# Patient Record
Sex: Female | Born: 1985 | Race: White | Hispanic: No | Marital: Single | Smoking: Never smoker
Health system: Southern US, Community
[De-identification: ages and names within clinical notes are randomized; demographics above are authoritative.]

## PROBLEM LIST (undated history)

## (undated) DIAGNOSIS — F32A Depression, unspecified: Secondary | ICD-10-CM

## (undated) DIAGNOSIS — F419 Anxiety disorder, unspecified: Secondary | ICD-10-CM

## (undated) DIAGNOSIS — F909 Attention-deficit hyperactivity disorder, unspecified type: Secondary | ICD-10-CM

## (undated) DIAGNOSIS — G8929 Other chronic pain: Secondary | ICD-10-CM

## (undated) DIAGNOSIS — R7303 Prediabetes: Secondary | ICD-10-CM

## (undated) DIAGNOSIS — G43909 Migraine, unspecified, not intractable, without status migrainosus: Secondary | ICD-10-CM

## (undated) HISTORY — DX: Migraine, unspecified, not intractable, without status migrainosus: G43.909

## (undated) HISTORY — DX: Other chronic pain: G89.29

## (undated) HISTORY — DX: Prediabetes: R73.03

## (undated) HISTORY — DX: Depression, unspecified: F32.A

## (undated) HISTORY — DX: Attention-deficit hyperactivity disorder, unspecified type: F90.9

## (undated) HISTORY — DX: Anxiety disorder, unspecified: F41.9

---
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
## 2008-06-15 ENCOUNTER — Other Ambulatory Visit: Admission: RE | Admit: 2008-06-15 | Discharge: 2008-06-15 | Payer: Self-pay | Admitting: Family Medicine

## 2009-01-08 HISTORY — PX: MOUTH SURGERY: SHX715

## 2009-05-06 ENCOUNTER — Other Ambulatory Visit: Admission: RE | Admit: 2009-05-06 | Discharge: 2009-05-06 | Payer: Self-pay | Admitting: Family Medicine

## 2010-01-27 ENCOUNTER — Encounter
Admission: RE | Admit: 2010-01-27 | Discharge: 2010-01-27 | Payer: Self-pay | Source: Home / Self Care | Attending: Family Medicine | Admitting: Family Medicine

## 2010-08-10 ENCOUNTER — Inpatient Hospital Stay (HOSPITAL_COMMUNITY)
Admission: EM | Admit: 2010-08-10 | Discharge: 2010-08-11 | DRG: 449 | Disposition: A | Discharge status: Other Acute Inpatient Hospital | Payer: BC Managed Care – PPO | Attending: Internal Medicine | Admitting: Internal Medicine

## 2010-08-10 DIAGNOSIS — F319 Bipolar disorder, unspecified: Secondary | ICD-10-CM | POA: Diagnosis present

## 2010-08-10 DIAGNOSIS — E876 Hypokalemia: Secondary | ICD-10-CM | POA: Diagnosis present

## 2010-08-10 DIAGNOSIS — H5500 Unspecified nystagmus: Secondary | ICD-10-CM | POA: Diagnosis present

## 2010-08-10 DIAGNOSIS — T426X1A Poisoning by other antiepileptic and sedative-hypnotic drugs, accidental (unintentional), initial encounter: Principal | ICD-10-CM | POA: Diagnosis present

## 2010-08-10 DIAGNOSIS — Y92009 Unspecified place in unspecified non-institutional (private) residence as the place of occurrence of the external cause: Secondary | ICD-10-CM

## 2010-08-10 DIAGNOSIS — T50992A Poisoning by other drugs, medicaments and biological substances, intentional self-harm, initial encounter: Secondary | ICD-10-CM | POA: Diagnosis present

## 2010-08-10 DIAGNOSIS — R269 Unspecified abnormalities of gait and mobility: Secondary | ICD-10-CM | POA: Diagnosis present

## 2010-08-10 LAB — CBC
HCT: 40.5 % (ref 36.0–46.0)
Hemoglobin: 14 g/dL (ref 12.0–15.0)
MCH: 30.6 pg (ref 26.0–34.0)
MCHC: 34.6 g/dL (ref 30.0–36.0)
MCV: 88.4 fL (ref 78.0–100.0)
Platelets: 286 10*3/uL (ref 150–400)
RBC: 4.58 MIL/uL (ref 3.87–5.11)
RDW: 12.5 % (ref 11.5–15.5)
WBC: 2.5 10*3/uL — ABNORMAL LOW (ref 4.0–10.5)

## 2010-08-10 LAB — COMPREHENSIVE METABOLIC PANEL
ALT: 10 U/L (ref 0–35)
AST: 11 U/L (ref 0–37)
Albumin: 4.3 g/dL (ref 3.5–5.2)
Alkaline Phosphatase: 83 U/L (ref 39–117)
BUN: 9 mg/dL (ref 6–23)
CO2: 24 mEq/L (ref 19–32)
Calcium: 9.4 mg/dL (ref 8.4–10.5)
Chloride: 102 mEq/L (ref 96–112)
Creatinine, Ser: 0.95 mg/dL (ref 0.50–1.10)
GFR calc Af Amer: >60 mL/min (ref >60)
GFR calc non Af Amer: >60 mL/min (ref >60)
Glucose, Bld: 84 mg/dL (ref 70–99)
Potassium: 3.5 mEq/L (ref 3.5–5.1)
Sodium: 138 mEq/L (ref 135–145)
Total Bilirubin: 0.5 mg/dL (ref 0.3–1.2)
Total Protein: 8 g/dL (ref 6.0–8.3)

## 2010-08-10 LAB — DIFFERENTIAL
Basophils Absolute: 0 10*3/uL (ref 0.0–0.1)
Basophils Relative: 0 % (ref 0–1)
Eosinophils Absolute: 0 10*3/uL (ref 0.0–0.7)
Eosinophils Relative: 0 % (ref 0–5)
Lymphocytes Relative: 24 % (ref 12–46)
Lymphs Abs: 0.6 10*3/uL — ABNORMAL LOW (ref 0.7–4.0)
Monocytes Absolute: 0.2 10*3/uL (ref 0.1–1.0)
Monocytes Relative: 8 % (ref 3–12)
Neutro Abs: 1.7 10*3/uL (ref 1.7–7.7)
Neutrophils Relative %: 67 % (ref 43–77)

## 2010-08-10 LAB — URINALYSIS, ROUTINE W REFLEX MICROSCOPIC
Bilirubin Urine: NEGATIVE
Glucose, UA: NEGATIVE mg/dL
Hgb urine dipstick: NEGATIVE
Specific Gravity, Urine: 1.034 — ABNORMAL HIGH (ref 1.005–1.030)
Urobilinogen, UA: 0.2 mg/dL (ref 0.0–1.0)
pH: 6 (ref 5.0–8.0)

## 2010-08-10 LAB — RAPID URINE DRUG SCREEN, HOSP PERFORMED
Amphetamines: NOT DETECTED
Benzodiazepines: NOT DETECTED
Cocaine: NOT DETECTED
Opiates: NOT DETECTED
Tetrahydrocannabinol: NOT DETECTED

## 2010-08-10 LAB — VALPROIC ACID LEVEL: Valproic Acid Lvl: 201.2 ug/mL (ref 50.0–100.0)

## 2010-08-10 LAB — ETHANOL: Alcohol, Ethyl (B): <11 mg/dL (ref 0–11)

## 2010-08-11 ENCOUNTER — Inpatient Hospital Stay (HOSPITAL_COMMUNITY)
Admission: AD | Admit: 2010-08-11 | Discharge: 2010-08-18 | DRG: 430 | Disposition: A | Discharge status: Home / Self Care | Payer: BC Managed Care – PPO | Source: Ambulatory Visit | Attending: Psychiatry | Admitting: Psychiatry

## 2010-08-11 DIAGNOSIS — F329 Major depressive disorder, single episode, unspecified: Secondary | ICD-10-CM

## 2010-08-11 DIAGNOSIS — T426X1A Poisoning by other antiepileptic and sedative-hypnotic drugs, accidental (unintentional), initial encounter: Secondary | ICD-10-CM

## 2010-08-11 DIAGNOSIS — F3289 Other specified depressive episodes: Secondary | ICD-10-CM

## 2010-08-11 DIAGNOSIS — E669 Obesity, unspecified: Secondary | ICD-10-CM

## 2010-08-11 DIAGNOSIS — T50992A Poisoning by other drugs, medicaments and biological substances, intentional self-harm, initial encounter: Secondary | ICD-10-CM

## 2010-08-11 DIAGNOSIS — F259 Schizoaffective disorder, unspecified: Principal | ICD-10-CM

## 2010-08-11 DIAGNOSIS — F29 Unspecified psychosis not due to a substance or known physiological condition: Secondary | ICD-10-CM

## 2010-08-11 LAB — COMPREHENSIVE METABOLIC PANEL
ALT: 9 U/L (ref 0–35)
Alkaline Phosphatase: 72 U/L (ref 39–117)
BUN: 9 mg/dL (ref 6–23)
CO2: 26 mEq/L (ref 19–32)
Calcium: 9.6 mg/dL (ref 8.4–10.5)
GFR calc Af Amer: >60 mL/min (ref >60)
GFR calc non Af Amer: >60 mL/min (ref >60)
Glucose, Bld: 94 mg/dL (ref 70–99)
Sodium: 137 mEq/L (ref 135–145)

## 2010-08-11 LAB — CBC
HCT: 36.8 % (ref 36.0–46.0)
Hemoglobin: 12.4 g/dL (ref 12.0–15.0)
MCH: 30 pg (ref 26.0–34.0)
RBC: 4.13 MIL/uL (ref 3.87–5.11)

## 2010-08-11 NOTE — H&P (Signed)
NAMEVON, INSCOE NO.:  [id number]  MEDICAL RECORD NO.:  [id number]  LOCATION:  WLED                         FACILITY:  [facility]  PHYSICIAN:  Gery Pray, MD      DATE OF BIRTH:  1985-05-14  DATE OF ADMISSION:  08/10/2010 DATE OF DISCHARGE:                             HISTORY & PHYSICAL   PRIMARY CARE PHYSICIAN:  Elvera Lennox, PA  PSYCHIATRIST:  Tiajuana Amass, MD  CODE STATUS:  Full code.  CHIEF COMPLAINT:  Depakote overdose.  HISTORY OF PRESENT ILLNESS:  This is a 25 year old female with known history of bipolar disease and depression.  Her mom noted that she was acting a little bit loopy and a little bit sleepy since yesterday. Apparently, there is a viral illness going around the house, therefore she thought initially felt nothing albeit sometime yesterday, the patient checked with her mom and told that she was having blurred vision.  She was having ringing, her ears felt full, and she was not feeling quite right.  Again, the mother attributed this to viral illness that was going around the house.  Today, the mom noted that she continued to act loopy and her gait was quite unsteady.  During further conversation, the patient finally told her mother that she took a bottle of Depakote from Tuesday, 2 days ago.  Per the mother, this is the medication she is no longer on.  It was almost a full bottle. Currently, still not clear as to the dose.  The mom took her in to the hospital.  Per the patient, she was not trying to commit suicide.  She was just curious to see what taking the bottle of medication would do to her. The patient does have a history of depression.  She was recently diagnosed with bipolar disease.  She has a history of cutting.  She has several laceration scars on her arm from cutting.  Here in the ER, the patient does answer questions.  She is alert and oriented x3.  She, however, remains a little bit loopy and somnolent.  Her gait is  a little bit off, however, per her family it is improved by comparison to this morning.  The patient does report that she has tinnitus.  She has nausea, but no vomiting.  She may have had a fever.  She does have some mild nonspecific abdominal pain.  She reports no chills, no diarrhea. She did have some headache, however, that is also improved.  She had some blurred vision, which is also improved.  History obtained from the patient as well as her parents who are at the bedside.  REVIEW OF SYSTEMS:  As noted in HPI.  All 10-point systems reviewed and otherwise negative.  PAST MEDICAL HISTORY: 1. Depression. 2. Bipolar disease.  PAST SURGICAL HISTORY:  Oral surgery.  medications: lamictal, tegretol  ALLERGIES:  She has LACTOSE intolerance, mild.  SOCIAL HISTORY:  Negative tobacco, alcohol, or illicit drugs.  She lives with her mom and dad.  FAMILY HISTORY:  Maternal grandfather had schizophrenia.  Otherwise, there is diabetes, hypertension, and brain tumor.  PHYSICAL EXAMINATION:  VITAL SIGNS:  Blood pressure 111/73, pulse 82, respiration 16, temperature  98.3, saturating 98% on room air. GENERAL:  The patient is sluggish, but she is alert and oriented. EYES:  No jaundice.  Pupils are quite dilated.  However, they are reactive to light and accommodating.  Pink conjunctivae. ENT:  Moist oral mucosa.  Trachea midline. NECK:  Supple. LUNGS:  Clear to auscultation.  No wheeze.  No use of accessory muscles. CARDIOVASCULAR:  Regular rate and rhythm without murmurs, rubs, or gallops. ABDOMEN:  Obese, soft, positive bowel sounds.  No significant tenderness on palpation.  Not acute surgical abdomen.  No organomegaly. NEUROLOGIC:  Cranial nerves II through XII grossly intact.  Sensation is intact.   MUSCULOSKELETOAL: is 5/5 in all extremities.  The patient does have mild nystagmus persisting. No clubbing, cyanosis, or edema.  The patient's gait as stated is little bit off.  However,  she ambulates independently. SKIN:  No rashes.  No subcutaneous crepitation.  The patient does have multiple laceration marks from cutting on her arms, much great on the left upper  extremity than the right.  LABORATORY DATA:  White blood count 2.6, hemoglobin 14, platelets 286. Sodium 138, potassium is 3.5, chloride 102, CO2 of 24, glucose 84, BUN 9, creatinine 0.9.  LFTs are normal with AST of 11, ALT of 10, albumin 4.3.  Alcohol level less than 11.  UA is essentially negative.  Depakote level is 201.2, normal is less than 100.  Acetaminophen level is less than 15.  Folate is less than 2.  Urine pregnancy test is negative.  ASSESSMENT AND PLAN: 1. Depakote overdose. 2. Possible suicidal ideation.  The patient will be admitted,     one-to-one sitter will be ordered.  I did discuss the patient with the     poison control center.  They recommended IV fluids and just     monitoring for resolution of nystagmus and to be sure that the depakote level     is trending down.  They also recommended charcoal without     sorbitol; however, they state is that this should     not be forced. If the patient is unable to tolerate the charchoal, its ok. .  The patient will be monitored as stated with a one-to-one, until cleared      for inpatient     psych.  The patient's parents are at the bedside and they are in     agreement with this plan. 3. Morbid obesity.  The patient's BMI is greater than 40.          ______________________________ Gery Pray, MD     DC/MEDQ  D:  08/10/2010  T:  08/10/2010  Job:  [id number]  Electronically Signed by Gery Pray MD on 08/11/2010 02:23:37 AM

## 2010-08-12 DIAGNOSIS — F259 Schizoaffective disorder, unspecified: Secondary | ICD-10-CM

## 2010-08-12 DIAGNOSIS — F329 Major depressive disorder, single episode, unspecified: Secondary | ICD-10-CM

## 2010-08-15 NOTE — Discharge Summary (Signed)
Sophia Roberts, SUCHECKI NO.:  [id number]  MEDICAL RECORD NO.:  [id number]  LOCATION:  1507                         FACILITY:  [facility]  PHYSICIAN:  Hillery Aldo, M.D.   DATE OF BIRTH:  1985-02-06  DATE OF ADMISSION:  08/10/2010 DATE OF DISCHARGE:  08/11/2010                        DISCHARGE SUMMARY - REFERRING   PRIMARY CARE PROVIDER:  Elvera Lennox, PA  PSYCHIATRIST:  Tiajuana Amass, MD  DISCHARGE DIAGNOSES: 1. Intentional Depakote overdose. 2. Nystagmus/gait disorder secondary to #1. 3. Suicidal ideation. 4. Hypokalemia. 5. Bipolar disorder with history of self-mutilation. 6. Morbid obesity. 7. Leukopenia.  DISCHARGE MEDICATIONS: 1. Wellbutrin 400 mg p.o. daily. 2. Tegretol 200 mg p.o. b.i.d. 3. Prozac 40 mg p.o. daily. 4. Lamictal 400 mg p.o. at bedtime. 5. Ambien 10 mg p.o. at bedtime p.r.n.  CONSULTATIONS:  Dr. Eulogio Ditch of psychiatry.  BRIEF ADMISSION HPI:  The patient is a 25 year old female who presented to the hospital with a chief complaint of gait disturbance and blurry vision after taking an unspecified amount of Depakote.  The patient apparently had several of these pills left over from having been on this medication in the past.  She had very poor insight as to why she overdosed but has a prior history of suicidal attempts and psychiatric disturbance.  She subsequently was evaluated in the emergency department and referred to the Hospitalist service for medical clearance and psychiatry consultation.  For full details, please see the dictated report done by Dr. Joneen Roach.  PROCEDURES AND DIAGNOSTIC STUDIES:  None.  DISCHARGE LABORATORY VALUES:  Valproic acid level 114.2.  Tegretol 2.7. Sodium 137, potassium 3.3, chloride 101, bicarb 26, BUN 9, creatinine 0.87, glucose 94.  Liver function studies were completely within normal limits.  White blood cell count was 2.7, hemoglobin 12.4, hematocrit 36.8, platelets 229.  HOSPITAL  COURSE: 1. Intentional Depakote overdose:  The patient's Depakote levels were     monitored closely and, at this point, have declined considerably.     Her liver function studies have remained normal.  She was monitored     closely and has been medically stable since admission.  Her care     was discussed with Poison Control who recommended supportive care     and IV fluids which the patient received.  They also recommended     charcoal administration which she was given.  She continues to have     some mild nystagmus but this is expected to resolve over time. 2. Suicidal ideation:  The patient was maintained with a Recruitment consultant     and a psychiatry consultation was requested and kindly provided by     Dr. Rogers Blocker who plans to accept her to the [facility] for further psychiatric care. 3. Hypokalemia:  The patient's potassium was repleted prior to     discharge. 4. History of bipolar disorder with self-mutilation:  The patient was     maintained on her psychoactive medications with plan to transfer to     [facility] for further psychiatric aftercare. 5. Morbid obesity:  Likely a combination of personal factors and     psychiatric medications.  The patient should  be put on a calorie-     restricted diet post discharge. 6. Leukopenia:  Unclear if this is acute or chronic.  I suspect this     is from her overdose, but would recommend a followup CBC in 3 days.  DISPOSITION:  The patient is medically stable to discharge to the [facility].  DISCHARGE INSTRUCTIONS: 1. Activity:  No restrictions. 2. Diet:  No restrictions.  Calorie-restricted diet when the patient     is emotionally stable and can handle a diet change.  Her appetite     is currently not good.  FOLLOWUP:  With Psychiatry as recommended by discharging MD from [facility].  RECOMMENDATIONS FOR LABORATORY FOLLOWUP:  Follow up CBC in 3 days.  CONDITION ON DISCHARGE:   Stable.  Time spent coordinating care for discharge, discharge instructions including face-to-face time, equals approximately 25 minutes.     Hillery Aldo, M.D.     CR/MEDQ  D:  08/11/2010  T:  08/11/2010  Job:  [id number]  cc:   Tiajuana Amass, MD Fax: [phone]  Elvera Lennox, PA [facility] [address] [place], [place] [address]  Electronically Signed by Hillery Aldo M.D. on 08/15/2010 07:10:58 AM

## 2010-08-17 LAB — DIFFERENTIAL
Basophils Relative: 0 % (ref 0–1)
Eosinophils Absolute: 0.1 10*3/uL (ref 0.0–0.7)
Monocytes Absolute: 0.5 10*3/uL (ref 0.1–1.0)
Monocytes Relative: 9 % (ref 3–12)
Neutrophils Relative %: 56 % (ref 43–77)

## 2010-08-17 LAB — COMPREHENSIVE METABOLIC PANEL
ALT: 21 U/L (ref 0–35)
AST: 14 U/L (ref 0–37)
Calcium: 10 mg/dL (ref 8.4–10.5)
GFR calc Af Amer: >60 mL/min (ref >60)
Sodium: 138 mEq/L (ref 135–145)
Total Protein: 7.7 g/dL (ref 6.0–8.3)

## 2010-08-17 LAB — CBC
MCH: 30 pg (ref 26.0–34.0)
MCHC: 34.4 g/dL (ref 30.0–36.0)
Platelets: 220 10*3/uL (ref 150–400)

## 2010-08-17 LAB — T3, FREE: T3, Free: 2.8 pg/mL (ref 2.3–4.2)

## 2010-08-17 NOTE — Consult Note (Signed)
Sophia Roberts, Sophia Roberts               ACCOUNT NO.:  [id number]  MEDICAL RECORD NO.:  [id number]  LOCATION:  1507                         FACILITY:  [facility]  PHYSICIAN:  Eulogio Ditch, MD DATE OF BIRTH:  [birth year]  DATE OF CONSULTATION:  08/11/2010 DATE OF DISCHARGE:                                CONSULTATION   REASON FOR CONSULTATION:  Depakote overdose, history of bipolar disorder.  HISTORY OF PRESENT ILLNESS:  25 year old female who has a long history of depression/bipolar disorder followed by Dr. Tomasa Rand in the outpatient psychiatry.  The patient was admitted on the medical floor after having blurred vision and ringing in the ears.  The patient overdosed on the Depakote 2 days ago before telling the mom and once she told the mom, the patient was brought to the hospital.  The patient told us that she was trying to see what Depakote will do to her after taking a full bottle of medications.  The patient denied that it was a suicideattempt.  I asked her about her education level at that point she told me that she is in a senior year of college at [employer] and pursuing music. I asked about her relationship, she told me she did not have any boyfriend, but in the past she had a female partner and she kind of likes female partner.  The patient seems to be pausing giving answer to the question during the interview, seems to be selecting her answers. On asking about her past suicide attempt, she told me last year in September, she took also overdose of Lamictal.  At that time also, she was seeing what Lamictal will do, but she also reported she was thinking of hurting herself, but the number of tablets she took of Lamictal she was knowing was thought going to kill her.  On asking about hearing voices, she told me, no she do not hear voices.  She does not seem to be internally preoccupied, but have thought blocking.  To answer that maybe, she was trying to select answer to the questions.   On asking about paranoid behavior, she told me sometimes she see that there is a monster standing in the room.  The patient is on Prozac 40 mg p.o. daily, trazodone 200 b.i.d., Lamictal 200 at bedtime, Ambien 10 mg bedtime, Wellbutrin XL 450 mg per day.  On asking her life goals, she said I do not know what I want to do in the life.  PAST MEDICAL HISTORY:  No active medical issues.  ALLERGIES:  She has LACTOSE intolerance.  SUBSTANCE ABUSE HISTORY:  The patient denies abusing alcohol or illicit drugs.  SOCIAL HISTORY:  The patient lives with her mom and dad and is in the senior year of college at [employer] pursuing music.  FAMILY HISTORY:  Maternal grandfather had schizophrenia.  MENTAL STATUS EXAMINATION:  The patient is calm, cooperative during interview.  No abnormal movements noticed.  Speech slow, soft, was pausing during the interview to answer the questions.  Mood, depressed. Affect, mood congruent.  Thought process, goal directed but not logical on asking questions regarding the overdose.  Reported paranoid behavior, but denied audiovisual hallucinations.  Does not seem to be  internally preoccupied.  Cognition:  Alert, awake, oriented x3.  Memory: Immediate, recent, remote fair.  Attention concentration fair. Abstraction ability fair.  Insight and judgment poor.  DIAGNOSES:  Axis I:  Mood disorder not otherwise specified, rule out bipolar disorder, depressed type. Axis II:  Deferred/rule out borderline personality traits as the patient has a history of cutting.  She had several laceration scars on her arm from cutting. Axis IV:  Unspecified. Axis V:  30.  RECOMMENDATIONS:  The patient will be admitted to Psychiatry for further stabilization.  We spoke with the mother and mother agreed with this treatment plan.  Once medically cleared, the patient can be transferred to Psychiatry at [facility].     Eulogio Ditch, MD     SA/MEDQ  D:  08/11/2010  T:   08/11/2010  Job:  [id number]  Electronically Signed by Eulogio Ditch  on 08/17/2010 08:54:38 AM

## 2010-08-20 NOTE — Assessment & Plan Note (Signed)
NAMETAMASHA, LAPLANTE NO.:  [id number]  MEDICAL RECORD NO.:  [id number]  LOCATION:  0505                          FACILITY:  [facility]  PHYSICIAN:  Franchot Gallo, MD     DATE OF BIRTH:  [birth year]  DATE OF ADMISSION:  08/11/2010 DATE OF DISCHARGE:                      PSYCHIATRIC ADMISSION ASSESSMENT   This is a psychiatric admission assessment regarding Sophia Roberts. This is a voluntary admission to the services of Dr. Harvie Heck Tell Rozelle. Today's date is August 12, 2010.  This is a 25 year old single white female who is currently a senior at [employer]. Apparently she has been having issues with sleep of late and she says that the other night when it was 3:00 in the morning she was not able to sleep.  She went downstairs to get some milk.  She noticed a bottle of Depakote.  She has not been prescribed Depakote for a while now. Impulsively she started taking the Depakote in an effort to go back to sleep.  She went to bed and the next day which she did not get up her parents thought that she was just having a viral illness like her brother.  This happened on Tuesday night.  On Thursday they became concerned and ended up bringing her to the hospital.  The patient told her mother she was having blurred vision.  She was having ringing in her ears and she just did not feel quite right.  The mom noted that she was acting loopy, her gait was unsteady and as the mom continued to question her the patient reported that she had taken this Depakote.  She denied that it was an attempt to commit suicide.  She does have a history of depression.  She was recently diagnosed with bipolar.  She does have a history for cutting and hence they brought her to the emergency room. She was admitted, she was kept overnight.  There was nothing to be done at this point as she did not need charcoal or anything.  It was worrisome in that she had overdosed last September on  Lamictal and she also reports that sometimes she thinks there is a monster in her room. She explains that there is a television show from [place] Dr. Who and somehow when she is not able to sleep at night this TV show comes into her head.  PAST PSYCHIATRIC HISTORY:  She has had some psychiatric care through Dr. Tomasa Rand for the past year.  She denies an actual psychiatric admission although she was seen in the ED up in [place] after she cut on her forearms.  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  FAMILY HISTORY:  Her maternal grandfather had schizophrenia.  ALCOHOL AND DRUG HISTORY:  None.  PRIMARY CARE PROVIDER:  Jaynie Collins, PA.  Her psychiatrist is Dr. Tomasa Rand.  MEDICAL PROBLEMS:  She does not have any known problems other than obesity.  MEDICATIONS:  [facility] states that on 07/19 she picked up Ambien 10 mg one at h.s. p.r.n., on 07/21 Lamictal 400 mg h.s. and on 07/26 Wellbutrin XL  150 mg take 3 in the morning.  DRUG ALLERGIES:  She has no known drug allergies.  POSITIVE PHYSICAL FINDINGS:  She was medically cleared.  Her physical exam is well-documented and on the chart.  MENTAL STATUS EXAM:  Today she is alert and oriented.  She is appropriately but casually groomed, dressed and nourished.  Her speech is a little bit slow.  Her mood is slightly depressed but she can respond to humor.  Her affect does have a range.  Her thought processes are somewhat clear, somewhat rational and goal oriented.  She realizes that what she did was dangerous although she denies any premedication. She denies being suicidal or homicidal.  She denies auditory or visual hallucinations.  Judgment and insight are fair.  Concentration and memory are intact.  Intelligence is average.  DIAGNOSES:  AXIS I:  Mood disorder, although I am suspicious that she could be prodromal for  schizophrenia.  She does have a family history with her maternal grandfather being schizophrenic. AXIS II:  History for self-mutilation. AXIS III:  Obesity. AXIS IV:  Unspecified. AXIS V:  30.  PLAN:  To admit for safety and stabilization.  Her meds will be adjusted as indicated.  Estimated length of stay is 3-5 days.  She already has psychiatric care in the community.     Mickie Leonarda Salon, P.A.-C.   ______________________________ Franchot Gallo, MD    MD/MEDQ  D:  08/12/2010  T:  08/12/2010  Job:  [id number]  Electronically Signed by Jaci Lazier ADAMS P.A.-C. on 08/19/2010 11:39:59 AM Electronically Signed by Franchot Gallo MD on 08/20/2010 09:51:26 PM

## 2010-08-23 NOTE — Discharge Summary (Signed)
  Sophia Roberts, Sophia Roberts               ACCOUNT NO.:  [id number]  MEDICAL RECORD NO.:  [id number]  LOCATION:  0505                          FACILITY:  [facility]  PHYSICIAN:  Franchot Gallo, MD     DATE OF BIRTH:  1985-10-10  DATE OF ADMISSION:  08/11/2010 DATE OF DISCHARGE:  08/18/2010                              DISCHARGE SUMMARY   REASON FOR ADMISSION:  This was a 25 year old female that was admitted with an overdose of Depakote.  She was a transfer from the medical floor after being stabilized.  FINAL IMPRESSION:  AXIS I:  Schizoaffective disorder, depressed type. AXIS II:  History of self-mutilation. AXIS III:  Obesity. AXIS IV:  Family discord. AXIS V:  70.  SIGNIFICANT FINDINGS:  The patient was admitted to the adult milieu for safety and stabilization.  Her medications were reviewed.  She was reporting problems with sleep, having frequent awakenings, trouble with her appetite.  Her depression was a 5 on a scale of 1-10.  She denied any suicidal or homicidal thoughts or psychotic symptoms.  We started Zoloft for her depression and continued with her Wellbutrin.  We also continued her Lamictal for mood stabilization and added Risperdal for a history of auditory hallucinations.  The patient continued with having moderate depressive symptoms and was having no suicidal ideation but had thoughts of cutting herself.  We increased her Zoloft to 75 mg for depression.  We had contact with the patient's mother to address any safety issues and for [name] to provide information on suicide risk factors, prevention and intervention.  The patient was reporting difficulty getting to sleep but then when she fell asleep she stayed asleep.  She was feeling detached from things and feeling foggy.  We continued to increase her Zoloft to 100 mg and discontinued her Ambien and tried Lunesta for sleep.  She was reporting feelings of detachment getting better.   On day of discharge the patient was seen in  the interdisciplinary team for concerns and questions.  She was reporting good sleep, good appetite, mild depressive symptoms rating it at a 2 on a scale of 1-10.  Denied any suicidal or homicidal thoughts or auditory hallucinations or delusional thinking, having mild to moderate anxiety, reporting no medication side effects.  DISCHARGE MEDICATIONS: 1. Lunesta 3 mg q.h.s. 2. Risperdal 0.5 mg q.h.s. 3. Zoloft 100 mg daily. 4. Wellbutrin 150 mg for depression. 5. Lamictal 100 mg taking 1-1/2 tablets daily at bedtime for mood. 6. The patient was to stop taking her Ambien, Tegretol and fluoxetine.  FOLLOWUP:  Her followup appointment was with Dr. Tomasa Rand at [facility] and phone number [phone] on Monday August 13 at 1:30 and an appointment with [facility] on Wednesday August 22 at 3:00 p.m.     Landry Corporal, N.P.   ______________________________ Franchot Gallo, MD    JO/MEDQ  D:  08/21/2010  T:  08/22/2010  Job:  [id number]  Electronically Signed by Limmie PatriciaP. on 08/22/2010 03:06:17 PM Electronically Signed by Franchot Gallo MD on 08/23/2010 08:15:59 AM

## 2011-05-03 ENCOUNTER — Ambulatory Visit (HOSPITAL_COMMUNITY)
Admission: RE | Admit: 2011-05-03 | Discharge: 2011-05-03 | Disposition: A | Discharge status: Home / Self Care | Payer: BC Managed Care – PPO | Attending: Psychiatry | Admitting: Psychiatry

## 2011-05-03 DIAGNOSIS — F319 Bipolar disorder, unspecified: Secondary | ICD-10-CM | POA: Insufficient documentation

## 2011-05-03 NOTE — BH Assessment (Signed)
Assessment Note   Sophia Roberts is a Caucasian, 26 y.o. female who presents voluntarily at [facility] with chief complaint of suicidal ideation. Pt denies suicidal plan. Pt states she has recently begun working at [employer] in [employer]. She had an appt today with Candace from [facility] who encouraged to come to [facility] to be assessed. Pt has had 2 prior suicide attempts. She was inpatient at [facility] Aug 2012. Pt endorses depressive symptoms including isolating behavior, worthlessness, and loss of interest. Pt endorses anxiety with anxiety attacks once per month. Pt denies HI and A/VH. No delusions noted. Pt saw her NP, Theodoro Clock at [facility], who prescribes her psychotropic meds 05/02/11. She used to see Francena Hanly at [facility] for therapy, but she hasn't see the therapist for 2 mos due to financial difficulties. Current stressors in pt's life include economic hardship and stress re: her new job at [employer]. Pt denies substance abuse. She drinks alcohol 1 per month, approx 10 oz per episode. Her first drink was when she was 22. 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 Patient was able to contract for safety. Pt states her parents are a source of emotional support.  Axis I: Bipolar I Disorder Axis II: Deferred Axis III: No past medical history on file. Axis IV: economic problems Axis V: 51-60 moderate symptoms  Past Medical History: No past medical history on file.  No past surgical history on file.  Family History: No family history on file.  Social History:  does not have a smoking history on file. She does not have any smokeless tobacco history on file. Her alcohol and drug histories not on file.  Additional Social History:  Alcohol / Drug Use Pain Medications: n/a Prescriptions: taken as prescribed Over the Counter: n/a History of alcohol / drug use?: Yes Substance #1 Name of Substance 1: alcohol 1 - Age of  First Use: 22 1 - Amount (size/oz): 10 oz 1 - Frequency: once per month 1 - Duration: 3 years 1 - Last Use / Amount: 1 month ago/10 oz Allergies: Allergies not on file  Home Medications:  (Not in a hospital admission)  OB/GYN Status:  No LMP recorded.  General Assessment Data Location of Assessment: [facility] Living Arrangements: Parent (mom,dad,siblings) Can pt return to current living arrangement?: Yes Admission Status: Voluntary Is patient capable of signing voluntary admission?: Yes Transfer from: Home Referral Source: Other (candace from [facility])  Education Status Is patient currently in school?: No Current Grade: n/a Highest grade of school patient has completed: didn't complete senior year of college Name of school: [employer] person: n/a  Risk to self Suicidal Ideation: Yes-Currently Present Suicidal Intent: No Is patient at risk for suicide?: No Suicidal Plan?: No Access to Means: No What has been your use of drugs/alcohol within the last 12 months?: alcohol once per month Previous Attempts/Gestures: Yes How many times?: 2  Other Self Harm Risks: cutting Triggers for Past Attempts: None known Intentional Self Injurious Behavior: Cutting (hasn't cut in 5 mons) Comment - Self Injurious Behavior: hasn't cut in 5 mos Family Suicide History: No Recent stressful life event(s): Financial Problems (and new job) Persecutory voices/beliefs?: No Depression: Yes Depression Symptoms: Isolating;Loss of interest in usual pleasures;Feeling worthless/self pity Substance abuse history and/or treatment for substance abuse?: No Suicide prevention information given to non-admitted patients: Yes  Risk to Others Homicidal Ideation: No Thoughts of Harm to Others: No Current Homicidal  Intent: No Current Homicidal Plan: No Access to Homicidal Means: No Identified Victim: n/a History of harm to others?: No Assessment of Violence: None Noted Violent Behavior Description:  n/a Does patient have access to weapons?: No Criminal Charges Pending?: No Does patient have a court date: No  Psychosis Hallucinations: None noted Delusions: None noted  Mental Status Report Appear/Hygiene:  (appropriately dressed) Eye Contact: Good Motor Activity: Freedom of movement Speech: Logical/coherent Level of Consciousness: Alert Mood:  (euthymic "much better than 5 mos ago") Affect: Appropriate to circumstance Anxiety Level: Panic Attacks Panic attack frequency: 1 per month Most recent panic attack: few weeks ago Thought Processes: Relevant;Coherent Judgement: Impaired Orientation: Situation;Time;Place;Person Obsessive Compulsive Thoughts/Behaviors: None  Cognitive Functioning Concentration: Normal Memory: Recent Intact;Remote Intact IQ: Average Insight: Good Impulse Control: Good Appetite: Good Weight Loss: 0  Weight Gain: 0  Sleep: No Change Total Hours of Sleep: 7  (w/ meds) Vegetative Symptoms: None  Prior Inpatient Therapy Prior Inpatient Therapy: Yes Prior Therapy Dates: Aug 2012 Prior Therapy Facilty/Provider(s): [facility] Reason for Treatment: suicide attempt/bipolar  Prior Outpatient Therapy Prior Outpatient Therapy: Yes Prior Therapy Dates: until 1 month ago Prior Therapy Facilty/Provider(s): Aline Brochure - [facility] Reason for Treatment: bipolar  ADL Screening (condition at time of admission) Patient's cognitive ability adequate to safely complete daily activities?: Yes Patient able to express need for assistance with ADLs?: Yes Independently performs ADLs?: Yes Weakness of Legs: None Weakness of Arms/Hands: None  Home Assistive Devices/Equipment Home Assistive Devices/Equipment: None    Abuse/Neglect Assessment (Assessment to be complete while patient is alone) Physical Abuse: Denies Verbal Abuse: Denies Sexual Abuse: Denies Exploitation of patient/patient's resources: Denies Self-Neglect: Denies       Merchant navy officer (For Healthcare) Advance Directive: Patient does not have advance directive;Patient would not like information    Additional Information 1:1 In Past 12 Months?: No CIRT Risk: No Elopement Risk: No Does patient have medical clearance?: Yes     Disposition:  Disposition Disposition of Patient: Other dispositions Other disposition(s): To current provider (pt contracted for safety and will contact candace at [facility])  On Site Evaluation by:   Reviewed with Physician:     Donnamarie Rossetti P 05/03/2011 4:10 PM

## 2013-02-20 ENCOUNTER — Other Ambulatory Visit: Payer: Self-pay | Admitting: Obstetrics & Gynecology

## 2013-02-20 ENCOUNTER — Other Ambulatory Visit (HOSPITAL_COMMUNITY)
Admission: RE | Admit: 2013-02-20 | Discharge: 2013-02-20 | Disposition: A | Discharge status: Home / Self Care | Payer: BC Managed Care – PPO | Source: Ambulatory Visit | Attending: Obstetrics & Gynecology | Admitting: Obstetrics & Gynecology

## 2013-02-20 DIAGNOSIS — Z01419 Encounter for gynecological examination (general) (routine) without abnormal findings: Secondary | ICD-10-CM | POA: Insufficient documentation

## 2013-04-17 ENCOUNTER — Ambulatory Visit
Admission: RE | Admit: 2013-04-17 | Discharge: 2013-04-17 | Disposition: A | Discharge status: Home / Self Care | Payer: BC Managed Care – PPO | Source: Ambulatory Visit | Attending: Physician Assistant | Admitting: Physician Assistant

## 2013-04-17 ENCOUNTER — Other Ambulatory Visit: Payer: Self-pay | Admitting: Physician Assistant

## 2013-04-17 DIAGNOSIS — Z139 Encounter for screening, unspecified: Secondary | ICD-10-CM

## 2014-06-23 IMAGING — CR DG CHEST 2V
2 series · 2 of 2 positions shown · non-contrast
Comparison: None available for comparison at time of study
interpretation.

CLINICAL DATA: Screening evaluation.

EXAM:
CHEST  2 VIEW

[view not recorded (1 of 2)]
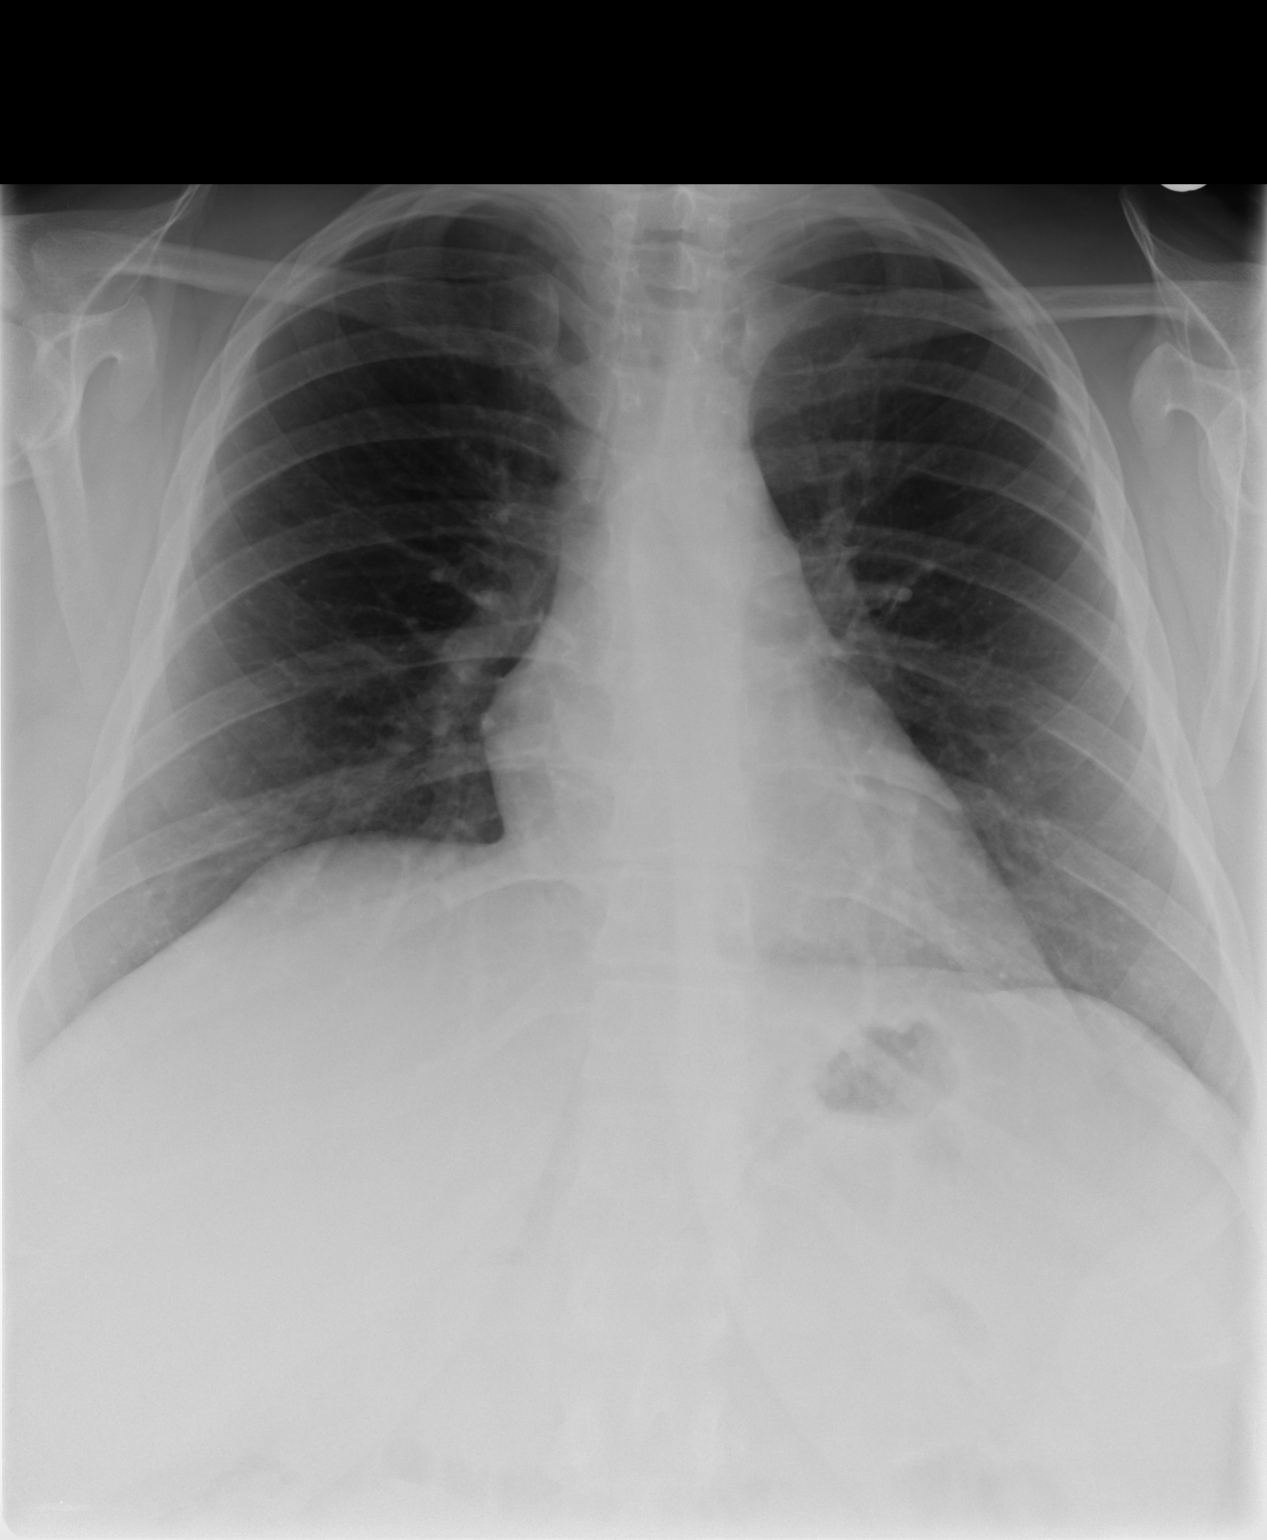

[view not recorded (2 of 2)]
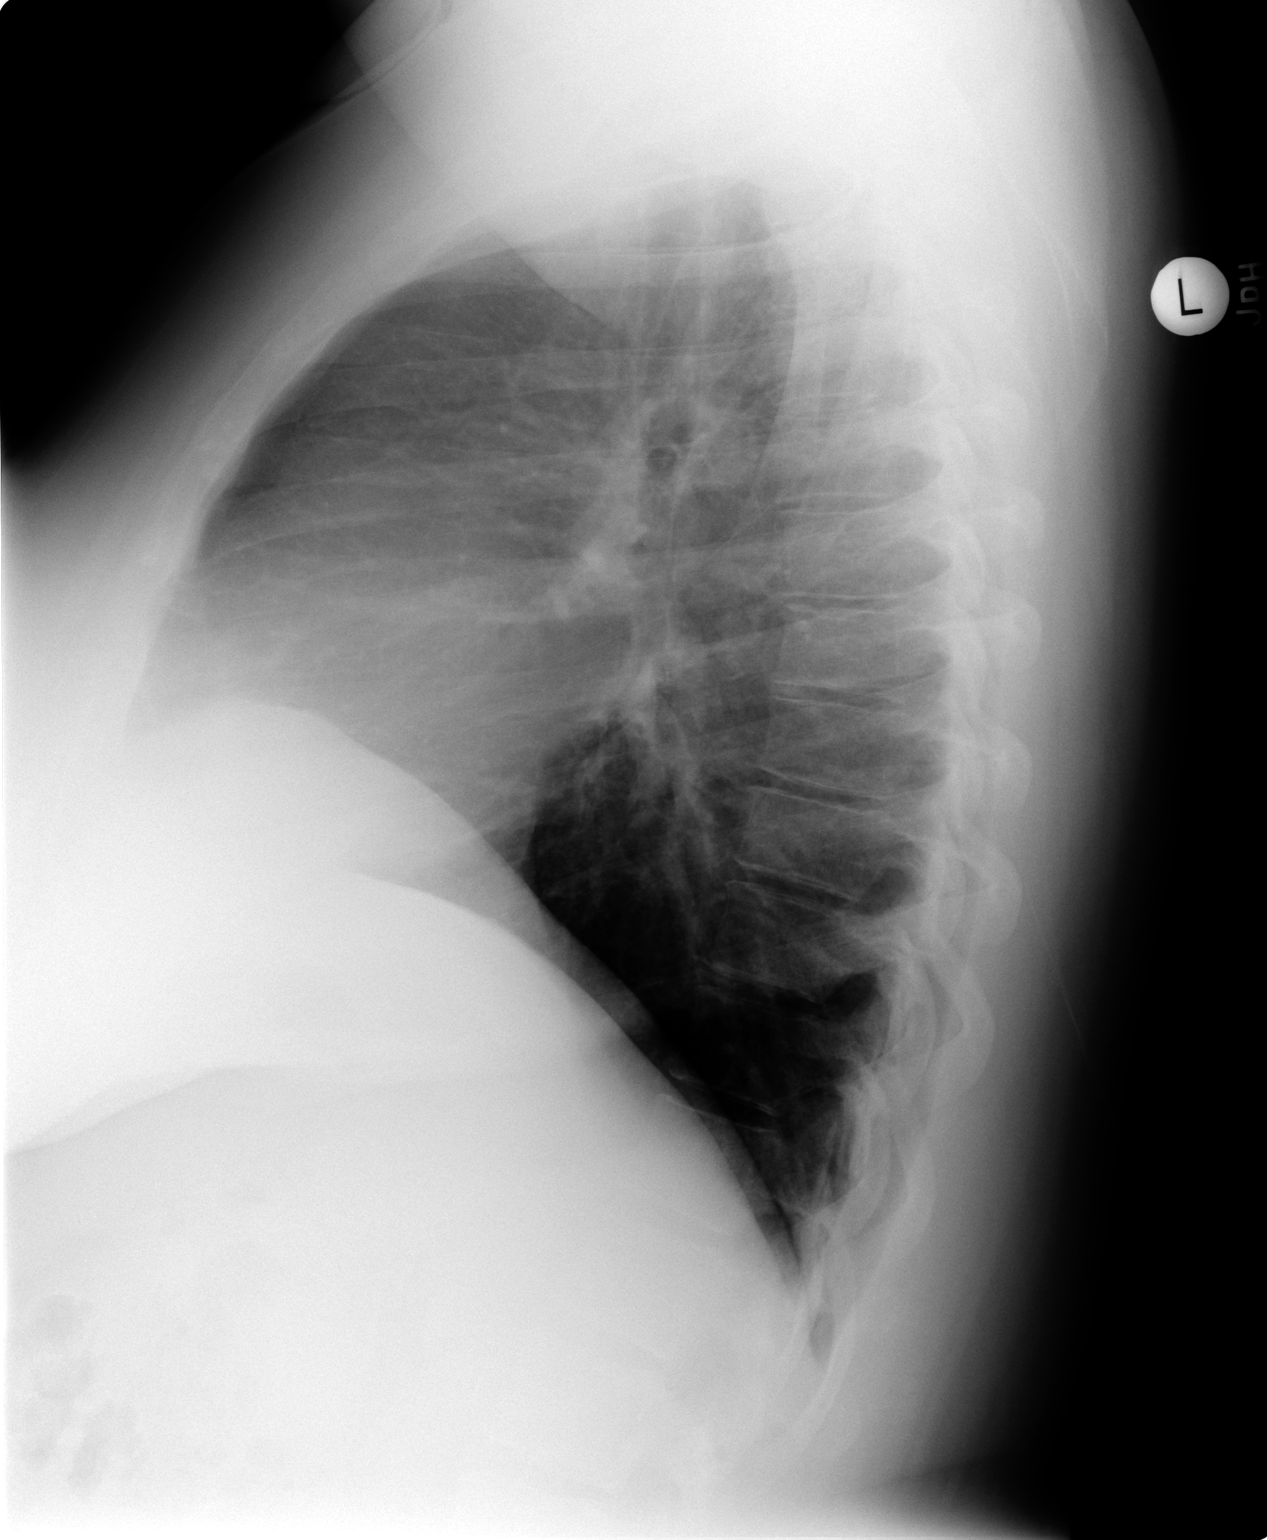

[2 of 2 positions shown; findings below may reference images not displayed]

FINDINGS: The heart size and mediastinal contours are within normal limits.
Both lungs are clear. The visualized skeletal structures are
unremarkable.
IMPRESSION: Normal chest.

  By: Vanya Villicana

## 2021-02-11 DIAGNOSIS — J019 Acute sinusitis, unspecified: Secondary | ICD-10-CM | POA: Diagnosis not present

## 2021-02-11 DIAGNOSIS — J018 Other acute sinusitis: Secondary | ICD-10-CM | POA: Diagnosis not present

## 2021-03-27 DIAGNOSIS — Z6841 Body Mass Index (BMI) 40.0 and over, adult: Secondary | ICD-10-CM | POA: Diagnosis not present

## 2021-03-27 DIAGNOSIS — R739 Hyperglycemia, unspecified: Secondary | ICD-10-CM | POA: Diagnosis not present

## 2021-03-27 DIAGNOSIS — Z0000 Encounter for general adult medical examination without abnormal findings: Secondary | ICD-10-CM | POA: Diagnosis not present

## 2021-05-11 DIAGNOSIS — F329 Major depressive disorder, single episode, unspecified: Secondary | ICD-10-CM | POA: Diagnosis not present

## 2021-06-08 DIAGNOSIS — F329 Major depressive disorder, single episode, unspecified: Secondary | ICD-10-CM | POA: Diagnosis not present

## 2021-06-22 DIAGNOSIS — F329 Major depressive disorder, single episode, unspecified: Secondary | ICD-10-CM | POA: Diagnosis not present

## 2021-06-30 DIAGNOSIS — E785 Hyperlipidemia, unspecified: Secondary | ICD-10-CM | POA: Diagnosis not present

## 2021-06-30 DIAGNOSIS — F419 Anxiety disorder, unspecified: Secondary | ICD-10-CM | POA: Diagnosis not present

## 2021-06-30 DIAGNOSIS — F909 Attention-deficit hyperactivity disorder, unspecified type: Secondary | ICD-10-CM | POA: Diagnosis not present

## 2021-06-30 DIAGNOSIS — R739 Hyperglycemia, unspecified: Secondary | ICD-10-CM | POA: Diagnosis not present

## 2021-06-30 DIAGNOSIS — Z79899 Other long term (current) drug therapy: Secondary | ICD-10-CM | POA: Diagnosis not present

## 2021-07-06 DIAGNOSIS — F329 Major depressive disorder, single episode, unspecified: Secondary | ICD-10-CM | POA: Diagnosis not present

## 2021-07-14 DIAGNOSIS — F329 Major depressive disorder, single episode, unspecified: Secondary | ICD-10-CM | POA: Diagnosis not present

## 2021-07-20 DIAGNOSIS — F329 Major depressive disorder, single episode, unspecified: Secondary | ICD-10-CM | POA: Diagnosis not present

## 2021-07-27 DIAGNOSIS — F329 Major depressive disorder, single episode, unspecified: Secondary | ICD-10-CM | POA: Diagnosis not present

## 2021-08-03 DIAGNOSIS — F329 Major depressive disorder, single episode, unspecified: Secondary | ICD-10-CM | POA: Diagnosis not present

## 2021-08-10 DIAGNOSIS — F329 Major depressive disorder, single episode, unspecified: Secondary | ICD-10-CM | POA: Diagnosis not present

## 2021-08-17 DIAGNOSIS — F329 Major depressive disorder, single episode, unspecified: Secondary | ICD-10-CM | POA: Diagnosis not present

## 2021-08-24 DIAGNOSIS — F329 Major depressive disorder, single episode, unspecified: Secondary | ICD-10-CM | POA: Diagnosis not present

## 2021-09-04 DIAGNOSIS — F419 Anxiety disorder, unspecified: Secondary | ICD-10-CM | POA: Diagnosis not present

## 2021-10-05 DIAGNOSIS — F419 Anxiety disorder, unspecified: Secondary | ICD-10-CM | POA: Diagnosis not present

## 2021-10-06 DIAGNOSIS — E785 Hyperlipidemia, unspecified: Secondary | ICD-10-CM | POA: Diagnosis not present

## 2021-10-06 DIAGNOSIS — F419 Anxiety disorder, unspecified: Secondary | ICD-10-CM | POA: Diagnosis not present

## 2021-10-06 DIAGNOSIS — R739 Hyperglycemia, unspecified: Secondary | ICD-10-CM | POA: Diagnosis not present

## 2021-10-06 DIAGNOSIS — Z6841 Body Mass Index (BMI) 40.0 and over, adult: Secondary | ICD-10-CM | POA: Diagnosis not present

## 2021-10-06 DIAGNOSIS — Z79899 Other long term (current) drug therapy: Secondary | ICD-10-CM | POA: Diagnosis not present

## 2021-10-16 DIAGNOSIS — F419 Anxiety disorder, unspecified: Secondary | ICD-10-CM | POA: Diagnosis not present

## 2021-10-25 DIAGNOSIS — F419 Anxiety disorder, unspecified: Secondary | ICD-10-CM | POA: Diagnosis not present

## 2021-10-29 DIAGNOSIS — J019 Acute sinusitis, unspecified: Secondary | ICD-10-CM | POA: Diagnosis not present

## 2022-01-05 DIAGNOSIS — E785 Hyperlipidemia, unspecified: Secondary | ICD-10-CM | POA: Diagnosis not present

## 2022-01-05 DIAGNOSIS — R739 Hyperglycemia, unspecified: Secondary | ICD-10-CM | POA: Diagnosis not present

## 2022-01-05 DIAGNOSIS — F419 Anxiety disorder, unspecified: Secondary | ICD-10-CM | POA: Diagnosis not present

## 2022-01-05 DIAGNOSIS — Z79899 Other long term (current) drug therapy: Secondary | ICD-10-CM | POA: Diagnosis not present

## 2022-01-05 DIAGNOSIS — Z6841 Body Mass Index (BMI) 40.0 and over, adult: Secondary | ICD-10-CM | POA: Diagnosis not present

## 2022-01-23 DIAGNOSIS — F902 Attention-deficit hyperactivity disorder, combined type: Secondary | ICD-10-CM | POA: Diagnosis not present

## 2022-01-23 DIAGNOSIS — F419 Anxiety disorder, unspecified: Secondary | ICD-10-CM | POA: Diagnosis not present

## 2022-01-23 DIAGNOSIS — F329 Major depressive disorder, single episode, unspecified: Secondary | ICD-10-CM | POA: Diagnosis not present

## 2022-02-01 DIAGNOSIS — F329 Major depressive disorder, single episode, unspecified: Secondary | ICD-10-CM | POA: Diagnosis not present

## 2022-02-01 DIAGNOSIS — F419 Anxiety disorder, unspecified: Secondary | ICD-10-CM | POA: Diagnosis not present

## 2022-02-01 DIAGNOSIS — F902 Attention-deficit hyperactivity disorder, combined type: Secondary | ICD-10-CM | POA: Diagnosis not present

## 2022-02-15 DIAGNOSIS — F902 Attention-deficit hyperactivity disorder, combined type: Secondary | ICD-10-CM | POA: Diagnosis not present

## 2022-02-15 DIAGNOSIS — F329 Major depressive disorder, single episode, unspecified: Secondary | ICD-10-CM | POA: Diagnosis not present

## 2022-02-15 DIAGNOSIS — F419 Anxiety disorder, unspecified: Secondary | ICD-10-CM | POA: Diagnosis not present

## 2022-02-21 DIAGNOSIS — F902 Attention-deficit hyperactivity disorder, combined type: Secondary | ICD-10-CM | POA: Diagnosis not present

## 2022-02-21 DIAGNOSIS — F329 Major depressive disorder, single episode, unspecified: Secondary | ICD-10-CM | POA: Diagnosis not present

## 2022-02-21 DIAGNOSIS — F419 Anxiety disorder, unspecified: Secondary | ICD-10-CM | POA: Diagnosis not present

## 2022-02-27 DIAGNOSIS — Z713 Dietary counseling and surveillance: Secondary | ICD-10-CM | POA: Diagnosis not present

## 2022-02-27 DIAGNOSIS — R739 Hyperglycemia, unspecified: Secondary | ICD-10-CM | POA: Diagnosis not present

## 2022-03-01 DIAGNOSIS — F419 Anxiety disorder, unspecified: Secondary | ICD-10-CM | POA: Diagnosis not present

## 2022-03-01 DIAGNOSIS — F329 Major depressive disorder, single episode, unspecified: Secondary | ICD-10-CM | POA: Diagnosis not present

## 2022-03-01 DIAGNOSIS — F902 Attention-deficit hyperactivity disorder, combined type: Secondary | ICD-10-CM | POA: Diagnosis not present

## 2022-03-08 DIAGNOSIS — F329 Major depressive disorder, single episode, unspecified: Secondary | ICD-10-CM | POA: Diagnosis not present

## 2022-03-08 DIAGNOSIS — F902 Attention-deficit hyperactivity disorder, combined type: Secondary | ICD-10-CM | POA: Diagnosis not present

## 2022-03-08 DIAGNOSIS — F419 Anxiety disorder, unspecified: Secondary | ICD-10-CM | POA: Diagnosis not present

## 2022-03-24 DIAGNOSIS — L040 Acute lymphadenitis of face, head and neck: Secondary | ICD-10-CM | POA: Diagnosis not present

## 2022-03-26 DIAGNOSIS — F3181 Bipolar II disorder: Secondary | ICD-10-CM | POA: Diagnosis not present

## 2022-03-29 DIAGNOSIS — R739 Hyperglycemia, unspecified: Secondary | ICD-10-CM | POA: Diagnosis not present

## 2022-03-29 DIAGNOSIS — Z6841 Body Mass Index (BMI) 40.0 and over, adult: Secondary | ICD-10-CM | POA: Diagnosis not present

## 2022-03-29 DIAGNOSIS — Z713 Dietary counseling and surveillance: Secondary | ICD-10-CM | POA: Diagnosis not present

## 2022-03-30 DIAGNOSIS — F419 Anxiety disorder, unspecified: Secondary | ICD-10-CM | POA: Diagnosis not present

## 2022-03-30 DIAGNOSIS — F329 Major depressive disorder, single episode, unspecified: Secondary | ICD-10-CM | POA: Diagnosis not present

## 2022-04-05 DIAGNOSIS — Z6841 Body Mass Index (BMI) 40.0 and over, adult: Secondary | ICD-10-CM | POA: Diagnosis not present

## 2022-04-05 DIAGNOSIS — R739 Hyperglycemia, unspecified: Secondary | ICD-10-CM | POA: Diagnosis not present

## 2022-04-05 DIAGNOSIS — Z0000 Encounter for general adult medical examination without abnormal findings: Secondary | ICD-10-CM | POA: Diagnosis not present

## 2022-04-10 DIAGNOSIS — F419 Anxiety disorder, unspecified: Secondary | ICD-10-CM | POA: Diagnosis not present

## 2022-04-10 DIAGNOSIS — F329 Major depressive disorder, single episode, unspecified: Secondary | ICD-10-CM | POA: Diagnosis not present

## 2022-04-18 DIAGNOSIS — F419 Anxiety disorder, unspecified: Secondary | ICD-10-CM | POA: Diagnosis not present

## 2022-04-18 DIAGNOSIS — F329 Major depressive disorder, single episode, unspecified: Secondary | ICD-10-CM | POA: Diagnosis not present

## 2022-04-25 DIAGNOSIS — F331 Major depressive disorder, recurrent, moderate: Secondary | ICD-10-CM | POA: Diagnosis not present

## 2022-04-25 DIAGNOSIS — F411 Generalized anxiety disorder: Secondary | ICD-10-CM | POA: Diagnosis not present

## 2022-05-10 DIAGNOSIS — F411 Generalized anxiety disorder: Secondary | ICD-10-CM | POA: Diagnosis not present

## 2022-05-10 DIAGNOSIS — F331 Major depressive disorder, recurrent, moderate: Secondary | ICD-10-CM | POA: Diagnosis not present

## 2022-05-16 DIAGNOSIS — F331 Major depressive disorder, recurrent, moderate: Secondary | ICD-10-CM | POA: Diagnosis not present

## 2022-05-16 DIAGNOSIS — F411 Generalized anxiety disorder: Secondary | ICD-10-CM | POA: Diagnosis not present

## 2022-05-29 ENCOUNTER — Ambulatory Visit: Payer: BC Managed Care – PPO | Admitting: Allergy

## 2022-05-29 ENCOUNTER — Encounter: Payer: Self-pay | Admitting: Allergy

## 2022-05-29 VITALS — BP 128/88 | HR 92 | Resp 18 | Ht 66.0 in | Wt 318.2 lb

## 2022-05-29 DIAGNOSIS — J302 Other seasonal allergic rhinitis: Secondary | ICD-10-CM | POA: Diagnosis not present

## 2022-05-29 DIAGNOSIS — J3089 Other allergic rhinitis: Secondary | ICD-10-CM

## 2022-05-29 MED ORDER — RYALTRIS 665-25 MCG/ACT NA SUSP: NASAL | 5 refills | Status: DC

## 2022-05-29 NOTE — Progress Notes (Signed)
New Patient Note  RE: Sophia Roberts MRN: [id number] DOB: 30-Jun-1985 Date of Office Visit: 05/29/2022  Primary care provider: Eunice Blase, PA-C  Chief Complaint: congestion, itchy eyes  History of present illness: Sophia Roberts is a 37 y.o. female presenting today for evaluation of allergic rhinitis.   She reports nasal congestion, runny nose with throat clearing and some coughing, itchy eyes/watery eyes, sneezing, some each itch/fullness.  Symptoms are year-round but worse in pollen season.  In the past several years she has had 2-3 sinus infections with antibiotics.  She states she has cycled through the over the counter antihistamines and states they seem to just stop working.   She has been recommended to use Flonase which has helped.  She has tried eye drops but states has been unsuccessful in putting them in.     She has 2 cats that sleep in the room with her.   No history of asthma, food allergy (states her mouth can itch sometimes but has not identified any specific foods), eczema.     Review of systems: Review of Systems  Constitutional: Negative.   HENT:         See HPI  Eyes:        See HPI  Respiratory:         See HPI  Cardiovascular: Negative.   Gastrointestinal: Negative.   Musculoskeletal: Negative.   Skin: Negative.   Allergic/Immunologic: Negative.   Neurological: Negative.     All other systems negative unless noted above in HPI  Past medical history: Past Medical History:  Diagnosis Date   ADHD    Anxiety    Chronic headaches    Depression    Migraines    Prediabetes     Past surgical history: Past Surgical History:  Procedure Laterality Date   Chiari Malformation Surgery   2001   MOUTH SURGERY  2011    Family history:  Family History  Problem Relation Age of Onset   Allergic rhinitis Mother    Diabetes Mother    Diabetes Father    Allergic rhinitis Father    Diabetes Sister    Allergic rhinitis Sister    Eczema Sister    Cancer  Maternal Grandfather    Cancer Paternal Grandfather     Social history: Lives in a home with carpeting in the bedroom with cats, dogs in the home.  No concern for water damage, mildew or roaches in the home. 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  Denies smoking history.    Medication List: Current Outpatient Medications  Medication Sig Dispense Refill   amphetamine-dextroamphetamine (ADDERALL) 10 MG tablet Take 10 mg by mouth 2 (two) times daily with a meal.     fluticasone (FLONASE) 50 MCG/ACT nasal spray Place 2 sprays into both nostrils daily.     gabapentin (NEURONTIN) 300 MG capsule Take 300 mg by mouth 2 (two) times daily.     Olopatadine-Mometasone (RYALTRIS) [id number] MCG/ACT SUSP Use 2 sprays in each nostril 1-2 times daily. 29 g 5   sertraline (ZOLOFT) 100 MG tablet Take 100 mg by mouth daily.     No current facility-administered medications for this visit.    Known medication allergies: No Known Allergies   Physical examination: Blood pressure 128/88, pulse 92, resp. rate 18, height 5\' 6"  (1.676 m), weight (!) 318 lb 3.2 oz (144.3 kg), SpO2 98 %.  General: Alert, interactive, in no acute distress. HEENT: PERRLA, TMs pearly  gray, turbinates moderately edematous without discharge, post-pharynx non erythematous. Neck: Supple without lymphadenopathy. Lungs: Clear to auscultation without wheezing, rhonchi or rales. {no increased work of breathing. CV: Normal S1, S2 without murmurs. Abdomen: Nondistended, nontender. Skin: Warm and dry, without lesions or rashes. Extremities:  No clubbing, cyanosis or edema. Neuro:   Grossly intact.  Diagnositics/Labs:  Allergy testing:   Airborne Adult Perc - 05/29/22 0937     Allergen Manufacturer Waynette Buttery    Location Back    Number of Test 59    Panel 1 Select    1. Control-Buffer 50% Glycerol Negative    2. Control-Histamine 1 mg/ml 2+    3. Albumin saline Negative    4. Bahia Negative    5.  French Southern Territories Negative    6. Johnson Negative    7. Kentucky Blue Negative    8. Meadow Fescue Negative    9. Perennial Rye Negative    10. Sweet Vernal Negative    11. Timothy Negative    12. Cocklebur Negative    13. Burweed Marshelder Negative    14. Ragweed, short Negative    15. Ragweed, Giant Negative    16. Plantain,  English Negative    17. Lamb's Quarters Negative    18. Sheep Sorrell Negative    19. Rough Pigweed Negative    20. Marsh Elder, Rough Negative    21. Mugwort, Common Negative    22. Ash mix Negative    23. Birch mix Negative    24. Beech American Negative    25. Box, Elder Negative    26. Cedar, red 2+    27. Cottonwood, Guinea-Bissau Negative    28. Elm mix Negative    29. Hickory Negative    30. Maple mix Negative    31. Oak, Guinea-Bissau mix Negative    32. Pecan Pollen Negative    33. Pine mix Negative    34. Sycamore Eastern Negative    35. Walnut, Black Pollen Negative    36. Alternaria alternata Negative    38. Aspergillus mix Negative    39. Penicillium mix 2+    40. Bipolaris sorokiniana (Helminthosporium) 2+    41. Drechslera spicifera (Curvularia) Negative    42. Mucor plumbeus 2+    43. Fusarium moniliforme 2+    44. Aureobasidium pullulans (pullulara) 2+    45. Rhizopus oryzae 2+    46. Botrytis cinera Negative    47. Epicoccum nigrum Negative    48. Phoma betae Negative    49. Candida Albicans Negative    50. Trichophyton mentagrophytes Negative    51. Mite, D Farinae  5,000 AU/ml Negative    52. Mite, D Pteronyssinus  5,000 AU/ml Negative    53. Cat Hair 10,000 BAU/ml Negative    54.  Dog Epithelia Negative    55. Mixed Feathers Negative    56. Horse Epithelia 2+    57. Cockroach, German 2+    58. Mouse 2+    59. Tobacco Leaf Negative             Intradermal - 05/29/22 0939     Allergen Manufacturer Waynette Buttery    Location Arm    Number of Test 11    Intradermal Select    Control Negative    French Southern Territories Negative    Johnson Negative    7  Grass Negative    Ragweed mix Negative    Weed mix Negative    Tree mix Negative    Mold 1 Negative  Cat Negative    Dog Negative    Mite mix Negative             Allergy testing results were read and interpreted by provider, documented by clinical staff.   Assessment and plan: Allergic rhinitis with conjunctivitis  - Testing today showed: trees, indoor molds, outdoor molds, cockroach, and rodent - Copy of test results provided.  - Avoidance measures provided. - Stop taking: Flonase (will replace with nasal spray below) - Start taking: Xyzal (levocetirizine) 5mg  tablet once daily.    If Xyzal is is not effective then would try Ryvent which is a prescription-based antihistamine.   Ryaltris (olopatadine/mometasone) two sprays per nostril 1-2 times daily as needed for runny or stuffy nose.  - You can use an extra dose of the antihistamine, if needed, for breakthrough symptoms.  - Consider nasal saline rinses 1-2 times daily to remove allergens from the nasal cavities as well as help with mucous clearance (this is especially helpful to do before the nasal sprays are given) - Consider allergy shots as a means of long-term control. - Allergy shots "re-train" and "reset" the immune system to ignore environmental allergens and decrease the resulting immune response to those allergens (sneezing, itchy watery eyes, runny nose, nasal congestion, etc).    - Allergy shots improve symptoms in 75-85% of patients.  - We can discuss more at the next appointment if the medications are not working for you.  Follow-up in 4-6 months or sooner if needed  I appreciate the opportunity to take part in Dona's care. Please do not hesitate to contact me with questions.  Sincerely,   Margo Aye, MD Allergy/Immunology [facility]

## 2022-05-29 NOTE — Patient Instructions (Addendum)
-   Testing today showed: trees, indoor molds, outdoor molds, cockroach, and rodent - Copy of test results provided.  - Avoidance measures provided. - Stop taking: Flonase (will replace with nasal spray below) - Start taking: Xyzal (levocetirizine) 5mg  tablet once daily.    If Xyzal is is not effective then would try Ryvent which is a prescription-based antihistamine.   Ryaltris (olopatadine/mometasone) two sprays per nostril 1-2 times daily as needed for runny or stuffy nose.  - You can use an extra dose of the antihistamine, if needed, for breakthrough symptoms.  - Consider nasal saline rinses 1-2 times daily to remove allergens from the nasal cavities as well as help with mucous clearance (this is especially helpful to do before the nasal sprays are given) - Consider allergy shots as a means of long-term control. - Allergy shots "re-train" and "reset" the immune system to ignore environmental allergens and decrease the resulting immune response to those allergens (sneezing, itchy watery eyes, runny nose, nasal congestion, etc).    - Allergy shots improve symptoms in 75-85% of patients.  - We can discuss more at the next appointment if the medications are not working for you.  Follow-up in 4-6 months or sooner if needed

## 2022-06-06 ENCOUNTER — Telehealth: Payer: Self-pay | Admitting: Allergy

## 2022-06-06 MED ORDER — CARBINOXAMINE MALEATE 6 MG PO TABS: 1.0000 | ORAL_TABLET | Freq: Every day | ORAL | 5 refills | Status: DC | PRN

## 2022-06-06 MED ORDER — CARBINOXAMINE MALEATE 6 MG PO TABS: 1.0000 | ORAL_TABLET | Freq: Three times a day (TID) | ORAL | 5 refills | Status: DC | PRN

## 2022-06-06 NOTE — Telephone Encounter (Signed)
Patient states Xyzal is not really helping and was wondering if we can send in Ryvent to [facility]

## 2022-06-06 NOTE — Telephone Encounter (Signed)
Pt informed Rx sent and she knows to have them apply the copay card.

## 2022-06-07 NOTE — Telephone Encounter (Signed)
PA submitted for Ryvent 6mg  Key: [id number]   [employer] [place] is processing your PA request and will respond shortly with next steps. You may close this dialog, return to your dashboard, and perform other tasks. To check for an update later, open this request again from your dashboard.  If you need assistance, please chat with CoverMyMeds or call us at [phone].

## 2022-06-13 ENCOUNTER — Telehealth: Payer: Self-pay

## 2022-06-13 DIAGNOSIS — F411 Generalized anxiety disorder: Secondary | ICD-10-CM | POA: Diagnosis not present

## 2022-06-13 DIAGNOSIS — F331 Major depressive disorder, recurrent, moderate: Secondary | ICD-10-CM | POA: Diagnosis not present

## 2022-06-13 NOTE — Telephone Encounter (Signed)
Pt knows to have them apply the copay card.

## 2022-06-13 NOTE — Telephone Encounter (Signed)
Patient Advocate Encounter  Received a fax from [facility] regarding Prior Authorization for RyVent 6MG  tablets.   Key: [id number]  Authorization has been DENIED due to

## 2022-06-14 ENCOUNTER — Other Ambulatory Visit: Payer: Self-pay | Admitting: Allergy

## 2022-06-14 MED ORDER — CARBINOXAMINE MALEATE 6 MG PO TABS: 1.0000 | ORAL_TABLET | Freq: Three times a day (TID) | ORAL | 5 refills | Status: DC | PRN

## 2022-06-14 NOTE — Addendum Note (Signed)
Addended by: Maryjean Morn D on: 06/14/2022 04:45 PM   Modules accepted: Orders

## 2022-06-15 DIAGNOSIS — J309 Allergic rhinitis, unspecified: Secondary | ICD-10-CM | POA: Diagnosis not present

## 2022-06-15 DIAGNOSIS — Z6841 Body Mass Index (BMI) 40.0 and over, adult: Secondary | ICD-10-CM | POA: Diagnosis not present

## 2022-06-15 DIAGNOSIS — H938X3 Other specified disorders of ear, bilateral: Secondary | ICD-10-CM | POA: Diagnosis not present

## 2022-06-15 MED ORDER — CARBINOXAMINE MALEATE 4 MG PO TABS: 2.0000 | ORAL_TABLET | Freq: Two times a day (BID) | ORAL | 5 refills | Status: DC | PRN

## 2022-06-15 NOTE — Telephone Encounter (Signed)
Dr. Delorse Lek- insurance requires the pt to try Carbinoxamine 4 mg first. Okay tot send?

## 2022-06-15 NOTE — Addendum Note (Signed)
Addended by: Maryjean Morn D on: 06/15/2022 04:09 PM   Modules accepted: Orders

## 2022-06-15 NOTE — Telephone Encounter (Signed)
Carbinoxamine sent to pharmacy and will confirm coverage with [facility] later.

## 2022-06-18 DIAGNOSIS — F3181 Bipolar II disorder: Secondary | ICD-10-CM | POA: Diagnosis not present

## 2022-06-20 NOTE — Telephone Encounter (Signed)
Confirmed with pharmacy- pt picked up on the 8th for $66.

## 2022-06-27 DIAGNOSIS — F331 Major depressive disorder, recurrent, moderate: Secondary | ICD-10-CM | POA: Diagnosis not present

## 2022-06-27 DIAGNOSIS — F411 Generalized anxiety disorder: Secondary | ICD-10-CM | POA: Diagnosis not present

## 2022-07-04 DIAGNOSIS — F411 Generalized anxiety disorder: Secondary | ICD-10-CM | POA: Diagnosis not present

## 2022-07-04 DIAGNOSIS — F331 Major depressive disorder, recurrent, moderate: Secondary | ICD-10-CM | POA: Diagnosis not present

## 2022-07-06 DIAGNOSIS — E785 Hyperlipidemia, unspecified: Secondary | ICD-10-CM | POA: Diagnosis not present

## 2022-07-06 DIAGNOSIS — R739 Hyperglycemia, unspecified: Secondary | ICD-10-CM | POA: Diagnosis not present

## 2022-07-06 DIAGNOSIS — Z6841 Body Mass Index (BMI) 40.0 and over, adult: Secondary | ICD-10-CM | POA: Diagnosis not present

## 2022-07-06 DIAGNOSIS — Z79899 Other long term (current) drug therapy: Secondary | ICD-10-CM | POA: Diagnosis not present

## 2022-07-06 DIAGNOSIS — M25561 Pain in right knee: Secondary | ICD-10-CM | POA: Diagnosis not present

## 2022-09-11 DIAGNOSIS — F3181 Bipolar II disorder: Secondary | ICD-10-CM | POA: Diagnosis not present

## 2022-09-24 DIAGNOSIS — G43909 Migraine, unspecified, not intractable, without status migrainosus: Secondary | ICD-10-CM | POA: Diagnosis not present

## 2022-09-24 DIAGNOSIS — R519 Headache, unspecified: Secondary | ICD-10-CM | POA: Diagnosis not present

## 2022-09-25 ENCOUNTER — Ambulatory Visit: Payer: BC Managed Care – PPO | Admitting: Allergy

## 2022-10-09 ENCOUNTER — Ambulatory Visit: Payer: Self-pay | Admitting: Allergy

## 2022-10-12 DIAGNOSIS — Z8669 Personal history of other diseases of the nervous system and sense organs: Secondary | ICD-10-CM | POA: Diagnosis not present

## 2022-10-12 DIAGNOSIS — G43709 Chronic migraine without aura, not intractable, without status migrainosus: Secondary | ICD-10-CM | POA: Diagnosis not present

## 2022-10-12 DIAGNOSIS — F418 Other specified anxiety disorders: Secondary | ICD-10-CM | POA: Diagnosis not present

## 2022-10-30 ENCOUNTER — Ambulatory Visit: Payer: BC Managed Care – PPO | Admitting: Allergy

## 2022-10-30 ENCOUNTER — Encounter: Payer: Self-pay | Admitting: Allergy

## 2022-10-30 VITALS — BP 118/66 | HR 96 | Resp 16

## 2022-10-30 DIAGNOSIS — H1013 Acute atopic conjunctivitis, bilateral: Secondary | ICD-10-CM | POA: Diagnosis not present

## 2022-10-30 DIAGNOSIS — J3089 Other allergic rhinitis: Secondary | ICD-10-CM | POA: Diagnosis not present

## 2022-10-30 DIAGNOSIS — J302 Other seasonal allergic rhinitis: Secondary | ICD-10-CM

## 2022-10-30 NOTE — Progress Notes (Signed)
Follow-up Note  RE: Sophia Roberts MRN: [id number] DOB: 1985/12/28 Date of Office Visit: 10/30/2022   History of present illness: Sophia Roberts is a 37 y.o. female presenting today for follow-up of allergic rhinitis with conjunctivitis.  She was last seen in the office on 05/29/2022 by myself.  Discussed the use of AI scribe software for clinical note transcription with the patient, who gave verbal consent to proceed.  The patient, with a known history of multiple allergies including tree pollen, molds, roaches, and rodents, has been managing symptoms with over-the-counter antihistamines. She reports taking it when remembered which she tries to do daily. Despite this, she notes a slight improvement in allergy symptoms. However, she has recently experienced a sensation of fullness and mild pain in her ears.  In the past, she has used Ryaltris, which she found helpful, but discontinued due to cost concerns. She also uses Pataday as needed for eye symptoms.  She also states the cost of the Ryvent was not affordable.  She states her mother typically goes and gets a generic antihistamine from the pharmacy and that she takes what ever they have at home.  She has not had any sinus infections requiring antibiotics in the past five to six months.  She also reports a history of migraines, for which she has recently started treatment. She is currently on a atenolol for migraine prevention and has been prescribed Ajovy by a neurologist. She also has a history of a Chiari malformation, for which she underwent surgery in 2001.  The patient has not had any major health changes, surgeries, or hospitalizations since the last visit.  Review of systems: 10pt ROS negative unless noted above in HPI  All other systems negative unless noted above in HPI  Past medical/social/surgical/family history have been reviewed and are unchanged unless specifically indicated below.  No changes  Medication List: Current  Outpatient Medications  Medication Sig Dispense Refill   amphetamine-dextroamphetamine (ADDERALL) 10 MG tablet Take 10 mg by mouth 2 (two) times daily with a meal.     gabapentin (NEURONTIN) 300 MG capsule Take 300 mg by mouth 2 (two) times daily.     sertraline (ZOLOFT) 100 MG tablet Take 100 mg by mouth daily.     No current facility-administered medications for this visit.     Known medication allergies: No Known Allergies   Physical examination: Blood pressure 118/66, pulse 96, resp. rate 16, SpO2 99%.  General: Alert, interactive, in no acute distress. HEENT: PERRLA, TMs pearly gray, turbinates moderately edematous without discharge, post-pharynx non erythematous. Neck: Supple without lymphadenopathy. Lungs: Clear to auscultation without wheezing, rhonchi or rales. {no increased work of breathing. CV: Normal S1, S2 without murmurs. Abdomen: Nondistended, nontender. Skin: Warm and dry, without lesions or rashes. Extremities:  No clubbing, cyanosis or edema. Neuro:   Grossly intact.  Diagnositics/Labs: None today  Assessment and plan: Allergic rhinitis with conjunctivitis  - Continue avoidance measures for trees, indoor molds, outdoor molds, cockroach, and rodent - Continue your antihistamine daily - Use nasal steroid spray like Nasacort, Rhinocort or Nasonex 2 sprays each nostril daily for 1-2 weeks at a time before stopping once nasal congestion improves for maximum benefit. Provided with a Ryaltris sample use two sprays per nostril 1-2 times daily as needed for runny or stuffy nose until you can get a nasal steroid from the pharmacy.  - You can use an extra dose of the antihistamine, if needed, for breakthrough symptoms.  - Consider nasal saline rinses 1-2 times  daily to remove allergens from the nasal cavities as well as help with mucous clearance (this is especially helpful to do before the nasal sprays are given) - Continue Pataday 1 drop each eye daily as needed for  itchy watery eyes - Consider allergy shots as a means of long-term control. - Allergy shots "re-train" and "reset" the immune system to ignore environmental allergens and decrease the resulting immune response to those allergens (sneezing, itchy watery eyes, runny nose, nasal congestion, etc).    - Allergy shots improve symptoms in 75-85% of patients.  - We can discuss more at a future appointment if the medications are not working for you.  Follow-up in 6 months or sooner if needed  I appreciate the opportunity to take part in Sophia Roberts's care. Please do not hesitate to contact me with questions.  Sincerely,   Margo Aye, MD Allergy/Immunology [facility]

## 2022-10-30 NOTE — Patient Instructions (Addendum)
-   Continue avoidance measures for trees, indoor molds, outdoor molds, cockroach, and rodent - Continue your antihistamine daily - Use nasal steroid spray like Nasacort, Rhinocort or Nasonex 2 sprays each nostril daily for 1-2 weeks at a time before stopping once nasal congestion improves for maximum benefit. Provided with a Ryaltris sample use two sprays per nostril 1-2 times daily as needed for runny or stuffy nose until you can get a nasal steroid from the pharmacy.  - You can use an extra dose of the antihistamine, if needed, for breakthrough symptoms.  - Consider nasal saline rinses 1-2 times daily to remove allergens from the nasal cavities as well as help with mucous clearance (this is especially helpful to do before the nasal sprays are given) - Continue Pataday 1 drop each eye daily as needed for itchy watery eyes - Consider allergy shots as a means of long-term control. - Allergy shots "re-train" and "reset" the immune system to ignore environmental allergens and decrease the resulting immune response to those allergens (sneezing, itchy watery eyes, runny nose, nasal congestion, etc).    - Allergy shots improve symptoms in 75-85% of patients.  - We can discuss more at a future appointment if the medications are not working for you.  Follow-up in 6 months or sooner if needed

## 2022-11-05 DIAGNOSIS — R739 Hyperglycemia, unspecified: Secondary | ICD-10-CM | POA: Diagnosis not present

## 2022-11-05 DIAGNOSIS — Z23 Encounter for immunization: Secondary | ICD-10-CM | POA: Diagnosis not present

## 2022-11-05 DIAGNOSIS — Z79899 Other long term (current) drug therapy: Secondary | ICD-10-CM | POA: Diagnosis not present

## 2022-11-05 DIAGNOSIS — G43909 Migraine, unspecified, not intractable, without status migrainosus: Secondary | ICD-10-CM | POA: Diagnosis not present

## 2022-11-05 DIAGNOSIS — R519 Headache, unspecified: Secondary | ICD-10-CM | POA: Diagnosis not present

## 2022-11-05 DIAGNOSIS — E785 Hyperlipidemia, unspecified: Secondary | ICD-10-CM | POA: Diagnosis not present

## 2022-11-28 DIAGNOSIS — G43709 Chronic migraine without aura, not intractable, without status migrainosus: Secondary | ICD-10-CM | POA: Diagnosis not present

## 2022-12-04 DIAGNOSIS — F3181 Bipolar II disorder: Secondary | ICD-10-CM | POA: Diagnosis not present

## 2022-12-04 DIAGNOSIS — J0100 Acute maxillary sinusitis, unspecified: Secondary | ICD-10-CM | POA: Diagnosis not present

## 2022-12-28 DIAGNOSIS — E236 Other disorders of pituitary gland: Secondary | ICD-10-CM | POA: Diagnosis not present

## 2022-12-31 DIAGNOSIS — G932 Benign intracranial hypertension: Secondary | ICD-10-CM | POA: Diagnosis not present

## 2022-12-31 DIAGNOSIS — Z8669 Personal history of other diseases of the nervous system and sense organs: Secondary | ICD-10-CM | POA: Diagnosis not present

## 2022-12-31 DIAGNOSIS — G43709 Chronic migraine without aura, not intractable, without status migrainosus: Secondary | ICD-10-CM | POA: Diagnosis not present

## 2023-02-08 DIAGNOSIS — H4710 Unspecified papilledema: Secondary | ICD-10-CM | POA: Diagnosis not present

## 2023-02-08 DIAGNOSIS — G43909 Migraine, unspecified, not intractable, without status migrainosus: Secondary | ICD-10-CM | POA: Diagnosis not present

## 2023-02-08 DIAGNOSIS — R739 Hyperglycemia, unspecified: Secondary | ICD-10-CM | POA: Diagnosis not present

## 2023-02-08 DIAGNOSIS — E785 Hyperlipidemia, unspecified: Secondary | ICD-10-CM | POA: Diagnosis not present

## 2023-02-08 DIAGNOSIS — Z79899 Other long term (current) drug therapy: Secondary | ICD-10-CM | POA: Diagnosis not present

## 2023-02-18 DIAGNOSIS — H4720 Unspecified optic atrophy: Secondary | ICD-10-CM | POA: Diagnosis not present

## 2023-02-21 DIAGNOSIS — J069 Acute upper respiratory infection, unspecified: Secondary | ICD-10-CM | POA: Diagnosis not present

## 2023-03-04 DIAGNOSIS — F3181 Bipolar II disorder: Secondary | ICD-10-CM | POA: Diagnosis not present

## 2023-03-19 DIAGNOSIS — G932 Benign intracranial hypertension: Secondary | ICD-10-CM | POA: Diagnosis not present

## 2023-04-02 DIAGNOSIS — G43709 Chronic migraine without aura, not intractable, without status migrainosus: Secondary | ICD-10-CM | POA: Diagnosis not present

## 2023-04-02 DIAGNOSIS — Z8669 Personal history of other diseases of the nervous system and sense organs: Secondary | ICD-10-CM | POA: Diagnosis not present

## 2023-04-02 DIAGNOSIS — G932 Benign intracranial hypertension: Secondary | ICD-10-CM | POA: Diagnosis not present

## 2023-04-09 ENCOUNTER — Other Ambulatory Visit: Payer: Self-pay

## 2023-04-09 MED ORDER — RYALTRIS 665-25 MCG/ACT NA SUSP: NASAL | 5 refills | Status: DC

## 2023-04-11 ENCOUNTER — Telehealth: Payer: Self-pay | Admitting: Allergy

## 2023-04-11 NOTE — Telephone Encounter (Signed)
 Patient is aware she is allergic to mold and was wondering if developing more allergies is a possibility or if she's probably being exposed to mold. Since January, she's noticed that she is having extreme sinus congestion, itchy eyes (Pataday is not helping), and a lot of sneezing. She feels that any time she steps in a room, she sneezes. She does feel that she is getting worse.

## 2023-04-15 NOTE — Telephone Encounter (Signed)
 Left message to call the office.

## 2023-04-18 MED ORDER — MONTELUKAST SODIUM 10 MG PO TABS: 10.0000 mg | ORAL_TABLET | Freq: Every day | ORAL | 5 refills | Status: DC

## 2023-04-18 NOTE — Telephone Encounter (Signed)
 Patient informed. Montelukast prescription was sent to pharmacy.

## 2023-04-18 NOTE — Addendum Note (Signed)
 Addended by: Deborra Medina on: 04/18/2023 03:49 PM   Modules accepted: Orders

## 2023-04-30 ENCOUNTER — Ambulatory Visit: Payer: BC Managed Care – PPO | Admitting: Allergy

## 2023-05-08 DIAGNOSIS — G43909 Migraine, unspecified, not intractable, without status migrainosus: Secondary | ICD-10-CM | POA: Diagnosis not present

## 2023-05-08 DIAGNOSIS — J309 Allergic rhinitis, unspecified: Secondary | ICD-10-CM | POA: Diagnosis not present

## 2023-05-08 DIAGNOSIS — E785 Hyperlipidemia, unspecified: Secondary | ICD-10-CM | POA: Diagnosis not present

## 2023-05-08 DIAGNOSIS — R739 Hyperglycemia, unspecified: Secondary | ICD-10-CM | POA: Diagnosis not present

## 2023-05-28 ENCOUNTER — Ambulatory Visit: Payer: Self-pay | Admitting: Allergy

## 2023-07-26 ENCOUNTER — Other Ambulatory Visit: Payer: Self-pay | Admitting: Allergy

## 2023-07-29 DIAGNOSIS — G932 Benign intracranial hypertension: Secondary | ICD-10-CM | POA: Diagnosis not present

## 2023-07-29 DIAGNOSIS — G43709 Chronic migraine without aura, not intractable, without status migrainosus: Secondary | ICD-10-CM | POA: Diagnosis not present

## 2023-08-03 DIAGNOSIS — M25561 Pain in right knee: Secondary | ICD-10-CM | POA: Diagnosis not present

## 2023-08-07 DIAGNOSIS — G43909 Migraine, unspecified, not intractable, without status migrainosus: Secondary | ICD-10-CM | POA: Diagnosis not present

## 2023-08-07 DIAGNOSIS — Z79899 Other long term (current) drug therapy: Secondary | ICD-10-CM | POA: Diagnosis not present

## 2023-08-07 DIAGNOSIS — E785 Hyperlipidemia, unspecified: Secondary | ICD-10-CM | POA: Diagnosis not present

## 2023-08-07 DIAGNOSIS — F419 Anxiety disorder, unspecified: Secondary | ICD-10-CM | POA: Diagnosis not present

## 2023-08-07 DIAGNOSIS — R739 Hyperglycemia, unspecified: Secondary | ICD-10-CM | POA: Diagnosis not present

## 2023-08-08 DIAGNOSIS — M25571 Pain in right ankle and joints of right foot: Secondary | ICD-10-CM | POA: Diagnosis not present

## 2023-08-08 DIAGNOSIS — M25561 Pain in right knee: Secondary | ICD-10-CM | POA: Diagnosis not present

## 2023-08-28 DIAGNOSIS — F331 Major depressive disorder, recurrent, moderate: Secondary | ICD-10-CM | POA: Diagnosis not present

## 2023-08-28 DIAGNOSIS — F908 Attention-deficit hyperactivity disorder, other type: Secondary | ICD-10-CM | POA: Diagnosis not present

## 2023-09-04 DIAGNOSIS — F900 Attention-deficit hyperactivity disorder, predominantly inattentive type: Secondary | ICD-10-CM | POA: Diagnosis not present

## 2023-09-04 DIAGNOSIS — F4320 Adjustment disorder, unspecified: Secondary | ICD-10-CM | POA: Diagnosis not present

## 2023-09-17 DIAGNOSIS — F4320 Adjustment disorder, unspecified: Secondary | ICD-10-CM | POA: Diagnosis not present

## 2023-09-17 DIAGNOSIS — F900 Attention-deficit hyperactivity disorder, predominantly inattentive type: Secondary | ICD-10-CM | POA: Diagnosis not present

## 2023-09-18 DIAGNOSIS — Z6841 Body Mass Index (BMI) 40.0 and over, adult: Secondary | ICD-10-CM | POA: Diagnosis not present

## 2023-09-18 DIAGNOSIS — M25561 Pain in right knee: Secondary | ICD-10-CM | POA: Diagnosis not present

## 2023-09-18 DIAGNOSIS — M25562 Pain in left knee: Secondary | ICD-10-CM | POA: Diagnosis not present

## 2023-09-18 DIAGNOSIS — G43909 Migraine, unspecified, not intractable, without status migrainosus: Secondary | ICD-10-CM | POA: Diagnosis not present

## 2023-09-24 ENCOUNTER — Encounter: Payer: Self-pay | Admitting: Allergy

## 2023-09-24 ENCOUNTER — Ambulatory Visit: Payer: Self-pay | Admitting: Allergy

## 2023-09-24 VITALS — BP 118/62 | HR 82 | Resp 16 | Ht 66.0 in | Wt 327.4 lb

## 2023-09-24 DIAGNOSIS — T50905D Adverse effect of unspecified drugs, medicaments and biological substances, subsequent encounter: Secondary | ICD-10-CM

## 2023-09-24 DIAGNOSIS — Z888 Allergy status to other drugs, medicaments and biological substances status: Secondary | ICD-10-CM

## 2023-09-24 DIAGNOSIS — J302 Other seasonal allergic rhinitis: Secondary | ICD-10-CM | POA: Diagnosis not present

## 2023-09-24 DIAGNOSIS — J3089 Other allergic rhinitis: Secondary | ICD-10-CM | POA: Diagnosis not present

## 2023-09-24 DIAGNOSIS — H1013 Acute atopic conjunctivitis, bilateral: Secondary | ICD-10-CM

## 2023-09-24 MED ORDER — RYALTRIS 665-25 MCG/ACT NA SUSP: NASAL | 5 refills | Status: AC

## 2023-09-24 MED ORDER — MONTELUKAST SODIUM 10 MG PO TABS: 10.0000 mg | ORAL_TABLET | Freq: Every day | ORAL | 5 refills | Status: AC

## 2023-09-24 NOTE — Progress Notes (Signed)
 Follow-up Note  RE: Sophia Roberts MRN: [id number] DOB: 1985-11-07 Date of Office Visit: 09/24/2023   History of present illness: Sophia Roberts is a 38 y.o. female presenting today for follow-up of allergic rhinitis with conjunctivitis.  She was last seen in the office on 10/30/2022 by myself. Discussed the use of AI scribe software for clinical note transcription with the patient, who gave verbal consent to proceed.  She has experienced worsening allergy  symptoms this year despite being on montelukast  and cetirizine. Initially, montelukast  provided some relief, but her symptoms have become persistent and severe. She reports that her symptoms have worsened this year.  She experiences difficulty breathing thru the nose, describing it as 'harder to take in air,' even when using nasal sprays like Ryaltris .  She has been rotating antihistamines, currently using cetirizine, but questions its long-term effectiveness. She has tried other antihistamines like Xyzal and Claritin in the past.  She reports a local allergic reaction to her monthly Ajovy injections for migraines, characterized by redness and itching at the injection site. The reaction has not exceeded palm size, and she manages it by tracking and documenting the reaction.  Her social history includes exposure to mold in her living and work environments, which she believes exacerbates her symptoms. She is actively working on reducing mold exposure in her car space.     Review of systems: 10pt ROS negative unless noted above in HPI  Past medical/social/surgical/family history have been reviewed and are unchanged unless specifically indicated below.  No changes  Medication List: Current Outpatient Medications  Medication Sig Dispense Refill   acetaZOLAMIDE ER (DIAMOX) 500 MG capsule Take 500 mg by mouth 2 (two) times daily.     atenolol (TENORMIN) 50 MG tablet Take 50 mg by mouth daily.     sertraline (ZOLOFT) 100 MG tablet Take 100 mg  by mouth daily.     montelukast  (SINGULAIR ) 10 MG tablet Take 1 tablet (10 mg total) by mouth at bedtime. 30 tablet 5   Olopatadine-Mometasone (RYALTRIS ) 665-25 MCG/ACT SUSP two sprays per nostril 1-2 times daily as needed 29 g 5   No current facility-administered medications for this visit.     Known medication allergies: No Known Allergies   Physical examination: Blood pressure 118/62, pulse 82, resp. rate 16, height 5' 6 (1.676 m), weight (!) 327 lb 6.4 oz (148.5 kg), SpO2 99%.  General: Alert, interactive, in no acute distress. HEENT: PERRLA, TMs pearly gray, turbinates non-edematous without discharge, post-pharynx non erythematous. Neck: Supple without lymphadenopathy. Lungs: Clear to auscultation without wheezing, rhonchi or rales. {no increased work of breathing. CV: Normal S1, S2 without murmurs. Abdomen: Nondistended, nontender. Skin: Warm and dry, without lesions or rashes. Extremities:  No clubbing, cyanosis or edema. Neuro:   Grossly intact.  Diagnostics/Labs: None today  Assessment and plan: Allergic rhinitis with conjunctivitis  - Continue avoidance measures for trees, indoor molds, outdoor molds, cockroach, and rodent - Continue your antihistamine daily.  Recommend rotating antihistamine every 3-6 months to maintain efficacy. - Continue Singulair  10mg  daily at bedtime.  - Use Ryaltris  sample use two sprays per nostril 1-2 times daily as needed for runny or stuffy nose until you can get a nasal steroid from the pharmacy.  - You can use an extra dose of the antihistamine, if needed, for breakthrough symptoms.  - Consider nasal saline rinses 1-2 times daily to remove allergens from the nasal cavities as well as help with mucous clearance (this is especially helpful to do before the nasal  sprays are given) - Continue Pataday 1 drop each eye daily as needed for itchy watery eyes - Consider allergy  shots as a means of long-term control. - Allergy  shots re-train and  reset the immune system to ignore environmental allergens and decrease the resulting immune response to those allergens (sneezing, itchy watery eyes, runny nose, nasal congestion, etc).    - Allergy  shots improve symptoms in 75-85% of patients.    Adverse medication effect -large local reactions at Ajovy injection site Discussed management strategies including pre-treatment and topical treatments. - Take extra antihistamine day before and day of Ajovy injection. - Apply ice to injection site to reduce inflammation. - Use topical steroid cream on injection site for itching and swelling. - Monitor reaction size and document with photos. Consult prescribing physician if reactions increase.  Follow-up in 6 months or sooner if needed    I appreciate the opportunity to take part in Sophia Roberts's care. Please do not hesitate to contact me with questions.  Sincerely,   Danita Brain, MD Allergy /Immunology [facility]

## 2023-09-24 NOTE — Patient Instructions (Addendum)
-   Continue avoidance measures for trees, indoor molds, outdoor molds, cockroach, and rodent - Continue your antihistamine daily.  Recommend rotating antihistamine every 3-6 months to maintain efficacy. - Continue Singulair  10mg  daily at bedtime.  - Use Ryaltris  sample use two sprays per nostril 1-2 times daily as needed for runny or stuffy nose until you can get a nasal steroid from the pharmacy.  - You can use an extra dose of the antihistamine, if needed, for breakthrough symptoms.  - Consider nasal saline rinses 1-2 times daily to remove allergens from the nasal cavities as well as help with mucous clearance (this is especially helpful to do before the nasal sprays are given) - Continue Pataday 1 drop each eye daily as needed for itchy watery eyes - Consider allergy  shots as a means of long-term control. - Allergy  shots re-train and reset the immune system to ignore environmental allergens and decrease the resulting immune response to those allergens (sneezing, itchy watery eyes, runny nose, nasal congestion, etc).    - Allergy  shots improve symptoms in 75-85% of patients.    Large local reactions at Ajovy injection site Discussed management strategies including pre-treatment and topical treatments. - Take extra antihistamine day before and day of Ajovy injection. - Apply ice to injection site to reduce inflammation. - Use topical steroid cream on injection site for itching and swelling. - Monitor reaction size and document with photos. Consult prescribing physician if reactions increase.  Follow-up in 6 months or sooner if needed

## 2023-09-25 DIAGNOSIS — F331 Major depressive disorder, recurrent, moderate: Secondary | ICD-10-CM | POA: Diagnosis not present

## 2023-10-01 DIAGNOSIS — F900 Attention-deficit hyperactivity disorder, predominantly inattentive type: Secondary | ICD-10-CM | POA: Diagnosis not present

## 2023-10-01 DIAGNOSIS — F4320 Adjustment disorder, unspecified: Secondary | ICD-10-CM | POA: Diagnosis not present

## 2023-10-08 DIAGNOSIS — F900 Attention-deficit hyperactivity disorder, predominantly inattentive type: Secondary | ICD-10-CM | POA: Diagnosis not present

## 2023-10-08 DIAGNOSIS — F4320 Adjustment disorder, unspecified: Secondary | ICD-10-CM | POA: Diagnosis not present

## 2023-10-15 DIAGNOSIS — F900 Attention-deficit hyperactivity disorder, predominantly inattentive type: Secondary | ICD-10-CM | POA: Diagnosis not present

## 2023-10-15 DIAGNOSIS — G932 Benign intracranial hypertension: Secondary | ICD-10-CM | POA: Diagnosis not present

## 2023-10-15 DIAGNOSIS — F4320 Adjustment disorder, unspecified: Secondary | ICD-10-CM | POA: Diagnosis not present

## 2023-10-17 DIAGNOSIS — E785 Hyperlipidemia, unspecified: Secondary | ICD-10-CM | POA: Diagnosis not present

## 2023-10-17 DIAGNOSIS — Z6841 Body Mass Index (BMI) 40.0 and over, adult: Secondary | ICD-10-CM | POA: Diagnosis not present

## 2023-10-17 DIAGNOSIS — G4710 Hypersomnia, unspecified: Secondary | ICD-10-CM | POA: Diagnosis not present

## 2023-10-17 DIAGNOSIS — G932 Benign intracranial hypertension: Secondary | ICD-10-CM | POA: Diagnosis not present

## 2023-10-25 ENCOUNTER — Other Ambulatory Visit: Payer: Self-pay | Admitting: Medical Genetics

## 2023-10-25 DIAGNOSIS — F4320 Adjustment disorder, unspecified: Secondary | ICD-10-CM | POA: Diagnosis not present

## 2023-10-25 DIAGNOSIS — F900 Attention-deficit hyperactivity disorder, predominantly inattentive type: Secondary | ICD-10-CM | POA: Diagnosis not present

## 2023-10-31 DIAGNOSIS — F4320 Adjustment disorder, unspecified: Secondary | ICD-10-CM | POA: Diagnosis not present

## 2023-10-31 DIAGNOSIS — F900 Attention-deficit hyperactivity disorder, predominantly inattentive type: Secondary | ICD-10-CM | POA: Diagnosis not present

## 2023-11-06 DIAGNOSIS — F331 Major depressive disorder, recurrent, moderate: Secondary | ICD-10-CM | POA: Diagnosis not present

## 2023-11-07 DIAGNOSIS — F4320 Adjustment disorder, unspecified: Secondary | ICD-10-CM | POA: Diagnosis not present

## 2023-11-07 DIAGNOSIS — F900 Attention-deficit hyperactivity disorder, predominantly inattentive type: Secondary | ICD-10-CM | POA: Diagnosis not present

## 2023-11-13 DIAGNOSIS — F4320 Adjustment disorder, unspecified: Secondary | ICD-10-CM | POA: Diagnosis not present

## 2023-11-13 DIAGNOSIS — F900 Attention-deficit hyperactivity disorder, predominantly inattentive type: Secondary | ICD-10-CM | POA: Diagnosis not present

## 2023-11-21 DIAGNOSIS — G932 Benign intracranial hypertension: Secondary | ICD-10-CM | POA: Diagnosis not present

## 2023-11-21 DIAGNOSIS — Z1331 Encounter for screening for depression: Secondary | ICD-10-CM | POA: Diagnosis not present

## 2023-11-21 DIAGNOSIS — R0683 Snoring: Secondary | ICD-10-CM | POA: Diagnosis not present

## 2023-11-21 DIAGNOSIS — G43711 Chronic migraine without aura, intractable, with status migrainosus: Secondary | ICD-10-CM | POA: Diagnosis not present

## 2023-11-25 DIAGNOSIS — J301 Allergic rhinitis due to pollen: Secondary | ICD-10-CM | POA: Diagnosis not present

## 2023-11-25 DIAGNOSIS — R0683 Snoring: Secondary | ICD-10-CM | POA: Diagnosis not present

## 2023-11-25 DIAGNOSIS — R5383 Other fatigue: Secondary | ICD-10-CM | POA: Diagnosis not present

## 2023-11-27 DIAGNOSIS — F4320 Adjustment disorder, unspecified: Secondary | ICD-10-CM | POA: Diagnosis not present

## 2023-11-27 DIAGNOSIS — F900 Attention-deficit hyperactivity disorder, predominantly inattentive type: Secondary | ICD-10-CM | POA: Diagnosis not present

## 2023-12-03 DIAGNOSIS — Z01419 Encounter for gynecological examination (general) (routine) without abnormal findings: Secondary | ICD-10-CM | POA: Diagnosis not present

## 2023-12-03 DIAGNOSIS — Z6841 Body Mass Index (BMI) 40.0 and over, adult: Secondary | ICD-10-CM | POA: Diagnosis not present

## 2023-12-04 DIAGNOSIS — G4733 Obstructive sleep apnea (adult) (pediatric): Secondary | ICD-10-CM | POA: Diagnosis not present

## 2023-12-10 ENCOUNTER — Encounter (HOSPITAL_BASED_OUTPATIENT_CLINIC_OR_DEPARTMENT_OTHER): Payer: Self-pay | Admitting: Neurology

## 2023-12-10 DIAGNOSIS — R5383 Other fatigue: Secondary | ICD-10-CM | POA: Diagnosis not present

## 2023-12-10 DIAGNOSIS — J301 Allergic rhinitis due to pollen: Secondary | ICD-10-CM | POA: Diagnosis not present

## 2023-12-10 DIAGNOSIS — G4733 Obstructive sleep apnea (adult) (pediatric): Secondary | ICD-10-CM | POA: Diagnosis not present

## 2023-12-11 ENCOUNTER — Encounter (HOSPITAL_BASED_OUTPATIENT_CLINIC_OR_DEPARTMENT_OTHER): Payer: Self-pay | Admitting: Neurology

## 2023-12-11 ENCOUNTER — Other Ambulatory Visit (HOSPITAL_BASED_OUTPATIENT_CLINIC_OR_DEPARTMENT_OTHER): Payer: Self-pay | Admitting: Neurology

## 2023-12-11 DIAGNOSIS — G43E19 Chronic migraine with aura, intractable, without status migrainosus: Secondary | ICD-10-CM

## 2023-12-11 DIAGNOSIS — G43711 Chronic migraine without aura, intractable, with status migrainosus: Secondary | ICD-10-CM

## 2023-12-13 ENCOUNTER — Encounter (HOSPITAL_BASED_OUTPATIENT_CLINIC_OR_DEPARTMENT_OTHER): Payer: Self-pay

## 2023-12-13 ENCOUNTER — Ambulatory Visit (HOSPITAL_BASED_OUTPATIENT_CLINIC_OR_DEPARTMENT_OTHER)
Admission: RE | Admit: 2023-12-13 | Discharge: 2023-12-13 | Disposition: A | Discharge status: Home / Self Care | Source: Ambulatory Visit

## 2023-12-13 VITALS — BP 151/87 | HR 80 | Temp 98.5°F | Resp 18

## 2023-12-13 DIAGNOSIS — G43901 Migraine, unspecified, not intractable, with status migrainosus: Secondary | ICD-10-CM

## 2023-12-13 MED ORDER — DEXAMETHASONE SOD PHOSPHATE PF 10 MG/ML IJ SOLN
10.0000 mg | Freq: Once | INTRAMUSCULAR | Status: AC
  Administered 2023-12-13: 10 mg via INTRAMUSCULAR

## 2023-12-13 MED ORDER — KETOROLAC TROMETHAMINE 30 MG/ML IJ SOLN
30.0000 mg | Freq: Once | INTRAMUSCULAR | Status: AC
  Administered 2023-12-13: 30 mg via INTRAMUSCULAR

## 2023-12-13 NOTE — ED Triage Notes (Addendum)
 Pt c/o headache/migraine x 2 weeks. Pt states that yesterday was really hard for her to focus with her eyes open. When turning head left to right feels very dizzy and nausea. Pt has hx of migraines. Pt states that she reached out to her neurologist and was told to come to a urgent care.

## 2023-12-13 NOTE — Discharge Instructions (Signed)
 We gave you a steroid injection and Toradol  for your migraine here today.  Recommend getting some meclizine over-the-counter for dizziness.    please get your MRI as scheduled ER for worsening symptoms

## 2023-12-14 NOTE — ED Provider Notes (Signed)
 [facility]    CSN: [id number] Arrival date & time: 12/13/23  1737      History   Chief Complaint Chief Complaint  Patient presents with   Headache    Accompanied by unusual dizziness and feeling of nausea; pain above eyes and base of skull / top of neck. Some symptoms lesson when eyes are closed and covered, but visual stimuli and movement makes everything worse. - Entered by patient    HPI Sophia Roberts is a 38 y.o. female.   Pt c/o headache/migraine x 2 weeks. Pt states that yesterday was really hard for her to focus with her eyes open. When turning head left to right feels very dizzy and nausea. Pt has hx of migraines. Pt states that she reached out to her neurologist and was told to come to a urgent care. This migraine feels different. She has MRI scheduled next week.    Headache   Past Medical History:  Diagnosis Date   ADHD    Anxiety    Chronic headaches    Depression    Migraines    Prediabetes     There are no active problems to display for this patient.   Past Surgical History:  Procedure Laterality Date   Chiari Malformation Surgery   2001   MOUTH SURGERY  2011    OB History   No obstetric history on file.      Home Medications    Prior to Admission medications   Medication Sig Start Date End Date Taking? Authorizing Provider  acetaZOLAMIDE ER (DIAMOX) 500 MG capsule Take 500 mg by mouth 2 (two) times daily.   Yes [provider]  atenolol (TENORMIN) 50 MG tablet Take 50 mg by mouth daily.   Yes [provider]  EMGALITY 120 MG/ML SOAJ ADMINISTER 1 ML UNDER THE SKIN EVERY 30 DAYS 11/21/23  Yes [provider]  HEATHER 0.35 MG tablet Take 1 tablet by mouth daily. 12/09/23  Yes [provider]  montelukast  (SINGULAIR ) 10 MG tablet Take 1 tablet (10 mg total) by mouth at bedtime. 09/24/23  Yes Padgett, Danita Macintosh, MD  sertraline (ZOLOFT) 100 MG tablet Take 100 mg by mouth daily.   Yes [provider]  Olopatadine-Mometasone (RYALTRIS ) 665-25 MCG/ACT SUSP two sprays per nostril 1-2 times daily as needed 09/24/23   Jeneal Danita Macintosh, MD    Family History Family History  Problem Relation Age of Onset   Allergic rhinitis Mother    Diabetes Mother    Diabetes Father    Allergic rhinitis Father    Diabetes Sister    Allergic rhinitis Sister    Eczema Sister    Cancer Maternal Grandfather    Cancer Paternal Grandfather     Social History Social History   Tobacco Use   Smoking status: Never    Passive exposure: Never   Smokeless tobacco: Never     Allergies   Patient has no known allergies.   Review of Systems Review of Systems  Neurological:  Positive for headaches.     Physical Exam Triage Vital Signs ED Triage Vitals  Encounter Vitals Group     BP 12/13/23 1815 (!) 151/87     Girls Systolic BP Percentile --      Girls Diastolic BP Percentile --      Boys Systolic BP Percentile --      Boys Diastolic BP Percentile --      Pulse Rate 12/13/23 1815 80  Resp 12/13/23 1815 18     Temp 12/13/23 1815 98.5 F (36.9 C)     Temp Source 12/13/23 1815 Oral     SpO2 12/13/23 1815 96 %     Weight --      Height --      Head Circumference --      Peak Flow --      Pain Score 12/13/23 1810 4     Pain Loc --      Pain Education --      Exclude from Growth Chart --    No data found.  Updated Vital Signs BP (!) 151/87 (BP Location: Right Arm)   Pulse 80   Temp 98.5 F (36.9 C) (Oral)   Resp 18   LMP 12/05/2023 (Approximate)   SpO2 96%   Visual Acuity Right Eye Distance:   Left Eye Distance:   Bilateral Distance:    Right Eye Near:   Left Eye Near:    Bilateral Near:     Physical Exam Vitals and nursing note reviewed.  Constitutional:      General: She is not in acute distress.    Appearance: Normal appearance. She is not ill-appearing, toxic-appearing or diaphoretic.  Eyes:     General: No visual field deficit.     Extraocular Movements: Extraocular movements intact.     Conjunctiva/sclera: Conjunctivae normal.     Pupils: Pupils are equal, round, and reactive to light.     Comments: Slight nystagmus with EOM looking the right.   Pulmonary:     Effort: Pulmonary effort is normal.  Neurological:     Mental Status: She is alert.     Cranial Nerves: No dysarthria or facial asymmetry.     Motor: No weakness.     Coordination: Coordination normal.     Gait: Gait normal.  Psychiatric:        Mood and Affect: Mood normal.        Speech: Speech normal.        Behavior: Behavior normal.      UC Treatments / Results  Labs (all labs ordered are listed, but only abnormal results are displayed) Labs Reviewed - No data to display  EKG   Radiology No results found.  Procedures Procedures (including critical care time)  Medications Ordered in UC Medications  ketorolac  (TORADOL ) 30 MG/ML injection 30 mg (30 mg Intramuscular Given 12/13/23 1855)  dexamethasone  (DECADRON ) injection 10 mg (10 mg Intramuscular Given 12/13/23 1855)    Initial Impression / Assessment and Plan / UC Course  I have reviewed the triage vital signs and the nursing notes.  Pertinent labs & imaging results that were available during my care of the patient were reviewed by me and considered in my medical decision making (see chart for details).     Migraine- pt with hx of migraines. Feels different than usual migraines. Does not usually have dizziness with these. This has been going on for 2 weeks. Believe that she is having some vertigo with her migraine. No specific concerns on exam today. None to send to the ER. She has MRI scheduled for next week which is good.  Treating her today for a migraine.  Gave her Toradol  and Decadron  here in clinic today.  Recommend getting some meclizine over-the-counter for dizziness. MRI next week as scheduled.  If symptoms worsen between now and then she will need to go to the ER. Final  Clinical Impressions(s) / UC Diagnoses   Final diagnoses:  Migraine with status migrainosus, not intractable, unspecified migraine type     Discharge Instructions      We gave you a steroid injection and Toradol  for your migraine here today.  Recommend getting some meclizine over-the-counter for dizziness.    please get your MRI as scheduled ER for worsening symptoms    ED Prescriptions   None    PDMP not reviewed this encounter.   Adah Wilbert LABOR, FNP 12/14/23 [phone]

## 2023-12-16 DIAGNOSIS — F4320 Adjustment disorder, unspecified: Secondary | ICD-10-CM | POA: Diagnosis not present

## 2023-12-16 DIAGNOSIS — F900 Attention-deficit hyperactivity disorder, predominantly inattentive type: Secondary | ICD-10-CM | POA: Diagnosis not present

## 2023-12-18 ENCOUNTER — Inpatient Hospital Stay (INDEPENDENT_AMBULATORY_CARE_PROVIDER_SITE_OTHER): Admission: RE | Admit: 2023-12-18 | Discharge: 2023-12-18 | Attending: Neurology | Admitting: Neurology

## 2023-12-18 DIAGNOSIS — R519 Headache, unspecified: Secondary | ICD-10-CM

## 2023-12-18 DIAGNOSIS — G43E19 Chronic migraine with aura, intractable, without status migrainosus: Secondary | ICD-10-CM

## 2023-12-18 DIAGNOSIS — G8929 Other chronic pain: Secondary | ICD-10-CM | POA: Diagnosis not present

## 2023-12-18 DIAGNOSIS — G43711 Chronic migraine without aura, intractable, with status migrainosus: Secondary | ICD-10-CM

## 2023-12-18 MED ORDER — GADOBUTROL 1 MMOL/ML IV SOLN
10.0000 mL | Freq: Once | INTRAVENOUS | Status: AC | PRN
  Administered 2023-12-18: 10 mL via INTRAVENOUS

## 2023-12-19 DIAGNOSIS — R739 Hyperglycemia, unspecified: Secondary | ICD-10-CM | POA: Diagnosis not present

## 2023-12-19 DIAGNOSIS — G4730 Sleep apnea, unspecified: Secondary | ICD-10-CM | POA: Diagnosis not present

## 2023-12-19 DIAGNOSIS — E785 Hyperlipidemia, unspecified: Secondary | ICD-10-CM | POA: Diagnosis not present

## 2023-12-19 DIAGNOSIS — G43909 Migraine, unspecified, not intractable, without status migrainosus: Secondary | ICD-10-CM | POA: Diagnosis not present

## 2023-12-23 ENCOUNTER — Other Ambulatory Visit (HOSPITAL_COMMUNITY)

## 2023-12-27 DIAGNOSIS — F331 Major depressive disorder, recurrent, moderate: Secondary | ICD-10-CM | POA: Diagnosis not present

## 2024-01-23 ENCOUNTER — Ambulatory Visit: Attending: Internal Medicine

## 2024-01-23 DIAGNOSIS — E669 Obesity, unspecified: Secondary | ICD-10-CM | POA: Insufficient documentation

## 2024-01-23 DIAGNOSIS — R0683 Snoring: Secondary | ICD-10-CM | POA: Insufficient documentation

## 2024-01-23 DIAGNOSIS — G4733 Obstructive sleep apnea (adult) (pediatric): Secondary | ICD-10-CM | POA: Diagnosis not present

## 2024-01-23 DIAGNOSIS — G43711 Chronic migraine without aura, intractable, with status migrainosus: Secondary | ICD-10-CM | POA: Insufficient documentation

## 2024-01-23 DIAGNOSIS — Z6841 Body Mass Index (BMI) 40.0 and over, adult: Secondary | ICD-10-CM | POA: Insufficient documentation

## 2024-01-24 ENCOUNTER — Other Ambulatory Visit
Admission: RE | Admit: 2024-01-24 | Discharge: 2024-01-24 | Disposition: A | Discharge status: Home / Self Care | Payer: Self-pay | Source: Ambulatory Visit | Attending: Medical Genetics | Admitting: Medical Genetics

## 2024-02-03 ENCOUNTER — Encounter (INDEPENDENT_AMBULATORY_CARE_PROVIDER_SITE_OTHER): Payer: Self-pay

## 2024-02-04 LAB — GENECONNECT MOLECULAR SCREEN: Genetic Analysis Overall Interpretation: NEGATIVE

## 2024-03-31 ENCOUNTER — Ambulatory Visit: Admitting: Allergy
